# Patient Record
Sex: Female | Born: 1975 | Race: White | Hispanic: No | Marital: Married | Smoking: Never smoker
Health system: Southern US, Community
[De-identification: ages and names within clinical notes are randomized; demographics above are authoritative.]

## PROBLEM LIST (undated history)

## (undated) DIAGNOSIS — G4730 Sleep apnea, unspecified: Secondary | ICD-10-CM

## (undated) DIAGNOSIS — M069 Rheumatoid arthritis, unspecified: Secondary | ICD-10-CM

## (undated) DIAGNOSIS — A6920 Lyme disease, unspecified: Secondary | ICD-10-CM

## (undated) DIAGNOSIS — E079 Disorder of thyroid, unspecified: Secondary | ICD-10-CM

## (undated) DIAGNOSIS — L409 Psoriasis, unspecified: Secondary | ICD-10-CM

## (undated) HISTORY — DX: Disorder of thyroid, unspecified: E07.9

## (undated) HISTORY — PX: DILATION AND CURETTAGE OF UTERUS: SHX78

## (undated) HISTORY — DX: Lyme disease, unspecified: A69.20

## (undated) HISTORY — DX: Rheumatoid arthritis, unspecified: M06.9

## (undated) HISTORY — DX: Sleep apnea, unspecified: G47.30

## (undated) HISTORY — PX: TONSILLECTOMY AND ADENOIDECTOMY: SHX28

## (undated) HISTORY — DX: Psoriasis, unspecified: L40.9

---

## 2012-02-03 DIAGNOSIS — E039 Hypothyroidism, unspecified: Secondary | ICD-10-CM | POA: Insufficient documentation

## 2012-02-03 DIAGNOSIS — L639 Alopecia areata, unspecified: Secondary | ICD-10-CM | POA: Insufficient documentation

## 2015-12-09 DIAGNOSIS — J301 Allergic rhinitis due to pollen: Secondary | ICD-10-CM | POA: Insufficient documentation

## 2016-10-24 DIAGNOSIS — L409 Psoriasis, unspecified: Secondary | ICD-10-CM | POA: Insufficient documentation

## 2017-08-18 NOTE — Progress Notes (Signed)
Office Visit Note  Patient: Mariah Palmer             Date of Birth: 11/06/1975           MRN: [id number]             PCP: Hermine Messick, MD Referring: Hermine Messick, MD Visit Date: 08/24/2017 Occupation: Web designer at [employer]    Subjective:  Pain in multiple joints.   History of Present Illness: Mariah Palmer is a 42 y.o. female seen in consultation per request of her PCP.  According to patient she was diagnosed with psoriasis while she was in middle school.  She states most the lesions were on her scalp, eyelids, over her years, bilateral elbows and bilateral knees.  She states she had seen a dermatologist over the years and was given topical agents.  Currently she has been using just moisturizers.  She has used clobetasol in the past.  She states her rash usually gets worse with the humidity.  She is also noticed some peeling in the skin of her palms.  She states in January 2018 she started having joint pain and difficulty even making the bed and pulling sheets.  She waited until April 2018 when she was seen by her PCP.  At that time she was experiencing pain in her hands and feet and stiffness in her knee joints and difficulty coming down the stairs.  She states she has stiffness and swelling in her hands.  She had to change the size of her rings from 7-9.  She started going to [facility] where she had a lot of testing and was advised to go on gluten-free, dairy free, ALT free diet.  She states she changed the diet in July and her pain level improved from 5 to 2.  In October 2018 she states her life became very stressful that she moved into a different house and also started working full-time.  She started noticing increased pain and stiffness in her hands and her pain almost reached to 5.  She went back to integrative therapies where she was advised to have Lyme test but it was too expensive she decided to take the treatment instead she recalls taking doxycycline in  February 2019 and noticed improvement in her hand symptoms.  She has been on a herbal regimen since then for the last 2 months.  She states that the stiffness is not so bad in her hands and feet now.  She notices some swelling in her hands which she describes over the MCPs and PIP joints.  She had some labs done by her PCP last year which was positive for rheumatoid factor.   Activities of Daily Living:  Patient reports morning stiffness for 2-4 hours.   Patient Denies nocturnal pain.  Difficulty dressing/grooming: Reports Difficulty climbing stairs: Reports Difficulty getting out of chair: Denies Difficulty using hands for taps, buttons, cutlery, and/or writing: Reports   Review of Systems  Constitutional: Positive for fatigue. Negative for fever, night sweats, weight gain and weight loss.  HENT: Negative for ear pain, mouth sores, trouble swallowing, trouble swallowing, mouth dryness and nose dryness.   Eyes: Negative for pain, redness, visual disturbance and dryness.  Respiratory: Negative for cough, shortness of breath and difficulty breathing.   Cardiovascular: Negative for chest pain, palpitations, hypertension, irregular heartbeat and swelling in legs/feet.  Gastrointestinal: Negative for blood in stool, constipation and diarrhea.  Endocrine: Negative for excessive thirst and increased urination.  Genitourinary: Negative for  difficulty urinating and vaginal dryness.  Musculoskeletal: Positive for arthralgias, joint pain, joint swelling, myalgias, morning stiffness and myalgias. Negative for muscle weakness and muscle tenderness.  Skin: Positive for rash. Negative for color change, hair loss, skin tightness, ulcers and sensitivity to sunlight.  Allergic/Immunologic: Negative for susceptible to infections.  Neurological: Negative for dizziness, numbness, memory loss, night sweats and weakness.  Hematological: Negative for bruising/bleeding tendency and swollen glands.    Psychiatric/Behavioral: Negative for depressed mood and sleep disturbance. The patient is not nervous/anxious.     PMFS History:  Patient Active Problem List   Diagnosis Date Noted  . Rheumatoid factor positive 08/24/2017  . Psoriasis 08/24/2017  . Acquired hypothyroidism 08/24/2017    Past Medical History:  Diagnosis Date  . Lyme disease   . Psoriasis     Family History  Problem Relation Age of Onset  . Prostate cancer Father    Past Surgical History:  Procedure Laterality Date  . DILATION AND CURETTAGE OF UTERUS    . TONSILLECTOMY AND ADENOIDECTOMY     Social History   Social History Narrative  . Not on file     Objective: Vital Signs: BP 101/66 (BP Location: Left Arm, Patient Position: Sitting, Cuff Size: Normal)   Pulse 75   Ht 5\' 6"  (1.676 m)   Wt 130 lb (59 kg)   BMI 20.98 kg/m    Physical Exam  Constitutional: She is oriented to person, place, and time. She appears well-developed and well-nourished.  HENT:  Head: Normocephalic and atraumatic.  Eyes: Conjunctivae and EOM are normal.  Neck: Normal range of motion.  Cardiovascular: Normal rate, regular rhythm, normal heart sounds and intact distal pulses.  Pulmonary/Chest: Effort normal and breath sounds normal.  Abdominal: Soft. Bowel sounds are normal.  Lymphadenopathy:    She has no cervical adenopathy.  Neurological: She is alert and oriented to person, place, and time.  Skin: Skin is warm and dry. Capillary refill takes less than 2 seconds.  Erythematous plaques over bilateral elbows and forearm assistant with psoriasis  Psychiatric: She has a normal mood and affect. Her behavior is normal.  Nursing note and vitals reviewed.    Musculoskeletal Exam: C-spine thoracic lumbar spine good range of motion.  She had no SI joint tenderness.  Shoulder joints elbow joints wrist joints are good range of motion.  She had some thickening of PIP DIP joints and some tenderness over bilateral first MCP joints.  No  synovitis was appreciated.  Hip joints knee joints ankles MTPs PIPs were in good range of motion.  She has tenderness over bilateral first MTP joint.  She has bilateral hallux valgus deformity.  CDAI Exam: No CDAI exam completed.    Investigation: No additional findings.    Imaging: Xr Foot 2 Views Left  Result Date: 08/24/2017 First MTP narrowing and subluxation was noted.  PIP and DIP narrowing was noted.  No other MTPs were involved.  No erosive changes were noted.  No intertarsal joint or subtalar joint space narrowing was noted. Impression: These findings are consistent with osteoarthritis of the foot.  Xr Foot 2 Views Right  Result Date: 08/24/2017 First MTP narrowing and subluxation was noted.  PIP and DIP narrowing was noted.  No other MTPs were involved.  No erosive changes were noted.  No intertarsal joint or subtalar joint space narrowing was noted. Impression: These findings are consistent with osteoarthritis of the foot.  Xr Hand 2 View Left  Result Date: 08/24/2017 PIP and DIP narrowing was  noted no erosive changes were noted no MCP intercarpal radiocarpal or CMC narrowing was noted. Impression: These findings are consistent with osteoarthritis of the hand.  Xr Hand 2 View Right  Result Date: 08/24/2017 PIP and DIP narrowing was noted no erosive changes were noted no MCP intercarpal radiocarpal or CMC narrowing was noted. Impression: These findings are consistent with osteoarthritis of the hand.   Speciality Comments: No specialty comments available.    Procedures:  No procedures performed Allergies: Patient has no allergy information on record.   Assessment / Plan:     Visit Diagnoses: Polyarthralgia - 07/27/16: Uric acid 3.9, ANA -, RF 58.2, Sed rate 8.  She gives history of pain in her bilateral hands bilateral knees and bilateral feet.  She also gives history of intermittent swelling.  Pain in both hands -she has DIP PIP thickening on examination today.  She  also has some tenderness over bilateral first MCP joints.  Plan: XR Hand 2 View Right, XR Hand 2 View Left.  I will schedule ultrasound of bilateral hands to evaluate this further.  Pain in both feet -she has bilateral first MTP thickening and valgus deformity.  Plan: XR Foot 2 Views Right, XR Foot 2 Views Left  Rheumatoid factor positive - RF 58.2   Psoriasis -she has long-standing psoriasis since she was in middle school.  Which is controlled with topical agents.  Acquired hypothyroidism -she is receiving treatment.   Orders: Orders Placed This Encounter  Procedures  . XR Hand 2 View Right  . XR Hand 2 View Left  . XR Foot 2 Views Right  . XR Foot 2 Views Left  . 14-3-3 eta Protein  . Cyclic citrul peptide antibody, IgG  . Rheumatoid factor  . Sedimentation rate   No orders of the defined types were placed in this encounter.   Face-to-face time spent with patient was 50 minutes.>50% of time was spent in counseling and coordination of care.  Follow-Up Instructions: Return for Polyarthralgia, psoriasis.   Bo Merino, MD  Note - This record has been created using Editor, commissioning.  Chart creation errors have been sought, but may not always  have been located. Such creation errors do not reflect on  the standard of medical care.

## 2017-08-24 ENCOUNTER — Ambulatory Visit (INDEPENDENT_AMBULATORY_CARE_PROVIDER_SITE_OTHER): Payer: Self-pay

## 2017-08-24 ENCOUNTER — Ambulatory Visit (INDEPENDENT_AMBULATORY_CARE_PROVIDER_SITE_OTHER): Payer: BLUE CROSS/BLUE SHIELD | Admitting: Rheumatology

## 2017-08-24 ENCOUNTER — Encounter: Payer: Self-pay | Admitting: Rheumatology

## 2017-08-24 VITALS — BP 101/66 | HR 75 | Ht 66.0 in | Wt 130.0 lb

## 2017-08-24 DIAGNOSIS — M79671 Pain in right foot: Secondary | ICD-10-CM

## 2017-08-24 DIAGNOSIS — E039 Hypothyroidism, unspecified: Secondary | ICD-10-CM | POA: Insufficient documentation

## 2017-08-24 DIAGNOSIS — R768 Other specified abnormal immunological findings in serum: Secondary | ICD-10-CM

## 2017-08-24 DIAGNOSIS — M2550 Pain in unspecified joint: Secondary | ICD-10-CM | POA: Diagnosis not present

## 2017-08-24 DIAGNOSIS — L409 Psoriasis, unspecified: Secondary | ICD-10-CM | POA: Diagnosis not present

## 2017-08-24 DIAGNOSIS — M79641 Pain in right hand: Secondary | ICD-10-CM

## 2017-08-24 DIAGNOSIS — M79642 Pain in left hand: Secondary | ICD-10-CM | POA: Diagnosis not present

## 2017-08-24 DIAGNOSIS — M79672 Pain in left foot: Secondary | ICD-10-CM | POA: Diagnosis not present

## 2017-08-24 NOTE — Patient Instructions (Signed)
Natural anti-inflammatories  You can purchase these at [employer], [employer] or online.  . Turmeric (capsules)  . Ginger (ginger root or capsules)  . Omega 3 (Fish, flax seeds, chia seeds, walnuts, almonds)  . Tart cherry (dried or extract)   Patient should be under the care of a physician while taking these supplements. This may not be reproduced without the permission of Dr. Ziyon Soltau.  

## 2017-08-25 NOTE — Progress Notes (Signed)
Labs are consistent with rheumatoid arthritis.  Please add G6PD.  We may consider adding Plaquenil at follow-up visit.

## 2017-08-28 LAB — 14-3-3 ETA PROTEIN: 14-3-3 eta Protein: <0.2 ng/mL (ref <0.2)

## 2017-08-28 LAB — GLUCOSE 6 PHOSPHATE DEHYDROGENASE: G-6PDH: 16.1 U/g Hgb (ref 7.0–20.5)

## 2017-08-28 LAB — CYCLIC CITRUL PEPTIDE ANTIBODY, IGG

## 2017-08-28 LAB — RHEUMATOID FACTOR: Rhuematoid fact SerPl-aCnc: 42 IU/mL — ABNORMAL HIGH (ref <14)

## 2017-08-28 LAB — TEST AUTHORIZATION

## 2017-08-28 LAB — SEDIMENTATION RATE: Sed Rate: 9 mm/h (ref 0–20)

## 2017-09-01 NOTE — Progress Notes (Deleted)
Visit Diagnoses: Polyarthralgia - 07/27/16: Uric acid 3.9, ANA -, RF 58.2, Sed rate 8.  She gives history of pain in her bilateral hands bilateral knees and bilateral feet.  She also gives history of intermittent swelling.  Pain in both hands -she has DIP PIP thickening on examination today.  She also has some tenderness over bilateral first MCP joints.  Plan: XR Hand 2 View Right, XR Hand 2 View Left.  I will schedule ultrasound of bilateral hands to evaluate this further.  Pain in both feet -she has bilateral first MTP thickening and valgus deformity.  Plan: XR Foot 2 Views Right, XR Foot 2 Views Left  Rheumatoid factor positive - RF 58.2   Psoriasis -she has long-standing psoriasis since she was in middle school.  Which is controlled with topical agents.  Acquired hypothyroidism -she is receiving treatment.  RF 42, anti-CCP > 250, 14 3 3  at a negative

## 2017-09-08 ENCOUNTER — Ambulatory Visit (INDEPENDENT_AMBULATORY_CARE_PROVIDER_SITE_OTHER): Payer: Self-pay

## 2017-09-08 ENCOUNTER — Ambulatory Visit (INDEPENDENT_AMBULATORY_CARE_PROVIDER_SITE_OTHER): Payer: BLUE CROSS/BLUE SHIELD | Admitting: Rheumatology

## 2017-09-08 DIAGNOSIS — M79641 Pain in right hand: Secondary | ICD-10-CM | POA: Diagnosis not present

## 2017-09-08 DIAGNOSIS — M79642 Pain in left hand: Secondary | ICD-10-CM

## 2017-09-13 DIAGNOSIS — M19041 Primary osteoarthritis, right hand: Secondary | ICD-10-CM | POA: Insufficient documentation

## 2017-09-13 DIAGNOSIS — M19072 Primary osteoarthritis, left ankle and foot: Secondary | ICD-10-CM

## 2017-09-13 DIAGNOSIS — M19071 Primary osteoarthritis, right ankle and foot: Secondary | ICD-10-CM | POA: Insufficient documentation

## 2017-09-13 DIAGNOSIS — M0579 Rheumatoid arthritis with rheumatoid factor of multiple sites without organ or systems involvement: Secondary | ICD-10-CM | POA: Insufficient documentation

## 2017-09-13 DIAGNOSIS — M19042 Primary osteoarthritis, left hand: Secondary | ICD-10-CM

## 2017-09-13 DIAGNOSIS — Z79899 Other long term (current) drug therapy: Secondary | ICD-10-CM | POA: Insufficient documentation

## 2017-09-13 NOTE — Progress Notes (Deleted)
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   History of Present Illness: Mariah Palmer is a 42 y.o. female ***   Activities of Daily Living:  Patient reports morning stiffness for *** {minute/hour:19697}.   Patient {ACTIONS;DENIES/REPORTS:21021675::"Denies"} nocturnal pain.  Difficulty dressing/grooming: {ACTIONS;DENIES/REPORTS:21021675::"Denies"} Difficulty climbing stairs: {ACTIONS;DENIES/REPORTS:21021675::"Denies"} Difficulty getting out of chair: {ACTIONS;DENIES/REPORTS:21021675::"Denies"} Difficulty using hands for taps, buttons, cutlery, and/or writing: {ACTIONS;DENIES/REPORTS:21021675::"Denies"}   No Rheumatology ROS completed.   PMFS History:  Patient Active Problem List   Diagnosis Date Noted  . Rheumatoid arthritis involving multiple sites with positive rheumatoid factor ([place]) 09/13/2017  . High risk medication use 09/13/2017  . Primary osteoarthritis of both hands 09/13/2017  . Primary osteoarthritis of both feet 09/13/2017  . Rheumatoid factor positive 08/24/2017  . Psoriasis 08/24/2017  . Acquired hypothyroidism 08/24/2017    Past Medical History:  Diagnosis Date  . Lyme disease   . Psoriasis     Family History  Problem Relation Age of Onset  . Prostate cancer Father    Past Surgical History:  Procedure Laterality Date  . DILATION AND CURETTAGE OF UTERUS    . TONSILLECTOMY AND ADENOIDECTOMY     Social History   Social History Narrative  . Not on file     Objective: Vital Signs: There were no vitals taken for this visit.   Physical Exam   Musculoskeletal Exam: ***  CDAI Exam: No CDAI exam completed.    Investigation: No additional findings. Aug 16, 2017 '14 3 3 ' at a negative, anti-CCP greater than 250, RF 42, ESR 9,  G6PD normal  Imaging: [place] Extrem Up Bilat Comp  Result Date: 09/08/2017 Ultrasound examination of bilateral hands was performed per EULAR recommendations. Using 12 MHz transducer, grayscale and power Doppler bilateral second, third, and fifth MCP joints and bilateral wrist joints both dorsal and volar aspects were evaluated to look for synovitis or tenosynovitis. The findings were there was synovitis in bilateral second third and fifth MCPs and bilateral wrist joints on ultrasound examination. Right median nerve was 0.08 cm squares which was normal limits and left median nerve was 0.09 cm squares which was the normal limits. Impression: Ultrasound examination consistent with synovitis in bilateral MCPs and wrist joints.  Bilateral median nerves are within normal limits.  Xr Foot 2 Views Left  Result Date: 08/24/2017 First MTP narrowing and subluxation was noted.  PIP and DIP narrowing was noted.  No other MTPs were involved.  No erosive changes were noted.  No intertarsal joint or subtalar joint space narrowing was noted. Impression: These findings are consistent with osteoarthritis of the foot.  Xr Foot 2 Views Right  Result Date: 08/24/2017 First MTP narrowing and subluxation was noted.  PIP and DIP narrowing was noted.  No other MTPs were involved.  No erosive changes were noted.  No intertarsal joint or subtalar joint space narrowing was noted. Impression: These findings are consistent with osteoarthritis of the foot.  Xr Hand 2 View Left  Result Date: 08/24/2017 PIP and DIP narrowing was noted no erosive changes were noted no MCP intercarpal radiocarpal or CMC narrowing was noted. Impression: These findings are consistent  with osteoarthritis of the hand.  Xr Hand 2 View Right  Result Date: 08/24/2017 PIP and DIP narrowing was noted no erosive changes were noted no MCP intercarpal radiocarpal or CMC narrowing was noted. Impression: These findings are consistent with osteoarthritis of the  hand.   Speciality Comments: No specialty comments available.    Procedures:  No procedures performed Allergies: Patient has no allergy information on record.   Assessment / Plan:     Visit Diagnoses: Rheumatoid arthritis involving multiple sites with positive rheumatoid factor (HCC) - Positive RF, positive anti-CCP, positive synovitis on ultrasound.  High risk medication use  Primary osteoarthritis of both hands  Primary osteoarthritis of both feet  Psoriasis  Acquired hypothyroidism    Orders: No orders of the defined types were placed in this encounter.  No orders of the defined types were placed in this encounter.   Face-to-face time spent with patient was *** minutes. 50% of time was spent in counseling and coordination of care.  Follow-Up Instructions: No follow-ups on file.   Bo Merino, MD  Note - This record has been created using Editor, commissioning.  Chart creation errors have been sought, but may not always  have been located. Such creation errors do not reflect on  the standard of medical care.

## 2017-09-15 ENCOUNTER — Telehealth: Payer: Self-pay | Admitting: Rheumatology

## 2017-09-15 NOTE — Telephone Encounter (Signed)
Mariah Palmer from [facility] called requesting the office visit notes from 08/24/17 to be sent to Dr. Louanna Raw.  Fax #  [phone]

## 2017-09-15 NOTE — Telephone Encounter (Signed)
Office noted faxed.

## 2017-09-15 NOTE — Telephone Encounter (Signed)
FYI: Patient canceled npt fu for now. Patient feels like you discussed with her the plan of action at the Ultrasound, and she is not ready to start medication. Plus patient got a large bill from her visits here, and has to work out payment before she returns.

## 2017-09-23 ENCOUNTER — Ambulatory Visit: Payer: Self-pay | Admitting: Rheumatology

## 2019-03-16 ENCOUNTER — Ambulatory Visit: Payer: BC Managed Care – PPO | Attending: Internal Medicine

## 2019-03-16 DIAGNOSIS — Z20822 Contact with and (suspected) exposure to covid-19: Secondary | ICD-10-CM

## 2019-03-18 LAB — NOVEL CORONAVIRUS, NAA: SARS-CoV-2, NAA: NOT DETECTED

## 2019-08-02 NOTE — Progress Notes (Signed)
Office Visit Note  Patient: Mariah Palmer             Date of Birth: [birth year]           MRN: [id number]             PCP: Hermine Messick, MD Referring: Hermine Messick, MD Visit Date: 08/10/2019 Occupation: @GUAROCC @  Subjective:  Pain in multiple joints   History of Present Illness: Mariah Palmer is a 44 y.o. female with history of seropositive rheumatoid arthritis and osteoarthritis.  She had her initial office visit on 08/24/2017 and had an ultrasound of both hands performed on 09/08/2017 which revealed active inflammation.  The patient did not follow-up after the ultrasound to discuss treatment options for rheumatoid arthritis.  She presents today with pain in multiple joints including both shoulders, both wrists, both hands, both ankles, and both feet. She currently has swelling in the right wrist joint and both hands.  She states she was sewing for about 30 minutes which exacerbated her right wrist pain and swelling.  She has been wearing an ace wrap but has not noticed much improvement.  She experiences nocturnal pain in both shoulder joints, and she takes tylenol or ibuprofen as needed for pain relief. She currently rates her pain a 3/10.   She denies any new medical problems since her initial office visit.     Activities of Daily Living:  Patient reports morning stiffness for 6 hours.   Patient Reports nocturnal pain.  Difficulty dressing/grooming: Reports Difficulty climbing stairs: Denies Difficulty getting out of chair: Denies Difficulty using hands for taps, buttons, cutlery, and/or writing: Reports  Review of Systems  Constitutional: Positive for fatigue.  HENT: Negative for mouth sores, mouth dryness and nose dryness.   Eyes: Negative for pain, visual disturbance and dryness.  Respiratory: Negative for cough, hemoptysis, shortness of breath and difficulty breathing.   Cardiovascular: Negative for chest pain, palpitations, hypertension and swelling in legs/feet.    Gastrointestinal: Negative for blood in stool, constipation and diarrhea.  Endocrine: Negative for excessive thirst and increased urination.  Genitourinary: Negative for difficulty urinating and painful urination.  Musculoskeletal: Positive for arthralgias, joint pain, joint swelling and morning stiffness. Negative for myalgias, muscle weakness, muscle tenderness and myalgias.  Skin: Negative for color change, pallor, rash, hair loss, nodules/bumps, skin tightness, ulcers and sensitivity to sunlight.  Allergic/Immunologic: Negative for susceptible to infections.  Neurological: Negative for dizziness, numbness, headaches and weakness.  Hematological: Negative for bruising/bleeding tendency and swollen glands.  Psychiatric/Behavioral: Negative for depressed mood and sleep disturbance. The patient is not nervous/anxious.     PMFS History:  Patient Active Problem List   Diagnosis Date Noted  . Rheumatoid arthritis involving multiple sites with positive rheumatoid factor ([place]) 09/13/2017  . High risk medication use 09/13/2017  . Primary osteoarthritis of both hands 09/13/2017  . Primary osteoarthritis of both feet 09/13/2017  . Rheumatoid factor positive 08/24/2017  . Psoriasis 08/24/2017  . Acquired hypothyroidism 08/24/2017    Past Medical History:  Diagnosis Date  . Psoriasis     Family History  Problem Relation Age of Onset  . Prostate cancer Father   . Heart disease Father    Past Surgical History:  Procedure Laterality Date  . DILATION AND CURETTAGE OF UTERUS    . TONSILLECTOMY AND ADENOIDECTOMY     Social History   Social History Narrative  . Not on file    There is no immunization history on file for this patient.  Objective: Vital Signs: BP 102/61 (BP Location: Left Arm, Patient Position: Sitting, Cuff Size: Normal)   Pulse 83   Resp 14   Ht 5\' 5"  (1.651 m)   Wt 143 lb 9.6 oz (65.1 kg)   BMI 23.90 kg/m    Physical Exam Vitals and nursing note reviewed.   Constitutional:      Appearance: She is well-developed.  HENT:     Head: Normocephalic and atraumatic.  Eyes:     Conjunctiva/sclera: Conjunctivae normal.  Pulmonary:     Effort: Pulmonary effort is normal.  Abdominal:     General: Bowel sounds are normal.     Palpations: Abdomen is soft.  Musculoskeletal:     Cervical back: Normal range of motion.  Lymphadenopathy:     Cervical: No cervical adenopathy.  Skin:    General: Skin is warm and dry.     Capillary Refill: Capillary refill takes less than 2 seconds.  Neurological:     Mental Status: She is alert and oriented to person, place, and time.  Psychiatric:        Behavior: Behavior normal.      Musculoskeletal Exam: C-spine, thoracic spine, and lumbar spine good ROM.  Painful ROM of both shoulder joints.  Tenderness of both shoulder joints noted on exam.  Elbow joints have good range of motion with no tenderness or inflammation.  She has tenderness and synovitis of the right wrist.  She has tenderness and synovitis of several MCP joints and PIP joints as described below.  She has PIP and DIP thickening consistent with osteoarthritis of both hands.  Hip joints have good range of motion with no discomfort.  Knee joints have good range of motion no warmth or effusion.  Ankle joints have good range of motion with tenderness bilaterally.  She has tenderness and synovitis of bilateral first MTP joints.  CDAI Exam: CDAI Score: -- Patient Global: --; Provider Global: -- Swollen: 10 ; Tender: 15  Joint Exam 08/10/2019      Right  Left  Glenohumeral   Tender   Tender  Wrist  Swollen Tender   Tender  MCP 1  Swollen Tender  Swollen Tender  MCP 2  Swollen Tender  Swollen Tender  MCP 3  Swollen Tender  Swollen Tender  PIP 5     Swollen Tender  Ankle   Tender   Tender  MTP 1  Swollen Tender  Swollen Tender     Investigation: No additional findings.  Imaging: XR Foot 2 Views Left  Result Date: 08/10/2019 First MTP narrowing  valgus deformity and subluxation was noted.  PIP and DIP narrowing was noted.  Possible erosion was noted over first PIP and fifth MTP.  No intertarsal tibiotalar joint space narrowing was noted. Impression: These findings are consistent with rheumatoid arthritis.  XR Foot 2 Views Right  Result Date: 08/10/2019 First MTP narrowing and valgus deformity was noted.  PIP and DIP narrowing was noted.  Juxta-articular osteopenia was noted.  No intertarsal or tibiotalar joint space narrowing was noted.  Possible cystic change versus erosion was noted in the right fifth MTP. Impression: These findings are consistent with rheumatoid arthritis.  XR Hand 2 View Left  Result Date: 08/10/2019 Juxta-articular osteopenia was noted.  Erosion was noted over the left first MCP.  No intercarpal radiocarpal joint space narrowing was noted.  PIP narrowing was noted. Impression: These findings are consistent with erosive rheumatoid arthritis.  XR Hand 2 View Right  Result Date: 08/10/2019 Juxta-articular osteopenia  was noted.  Right second MCP erosion was noted.  No intercarpal radiocarpal joint space narrowing was noted.  Mild PIP narrowing was noted. Impression: These findings are consistent with erosive rheumatoid arthritis.  XR Shoulder Left  Result Date: 08/10/2019 No glenohumeral or subacromial joint space narrowing was noted.  No chondrocalcinosis was noted. Impression: Unremarkable x-ray of the shoulder.  XR Shoulder Right  Result Date: 08/10/2019 No glenohumeral or acromioclavicular joint space narrowing was noted.  No chondrocalcinosis was noted. Impression: Unremarkable x-ray of the shoulder joint.   Recent Labs: No results found for: WBC, HGB, PLT, NA, K, CL, CO2, GLUCOSE, BUN, CREATININE, BILITOT, ALKPHOS, AST, ALT, PROT, ALBUMIN, CALCIUM, GFRAA, QFTBGOLD, QFTBGOLDPLUS  Speciality Comments: No specialty comments available.  Procedures:  No procedures performed Allergies: Patient has no known  allergies.   Assessment / Plan:     Visit Diagnoses: Rheumatoid arthritis with rheumatoid factor of multiple sites without organ or systems involvement ([place]) - RF+, Anti-CCP+, U/s +synovitis 2019: She has tenderness and synovitis of multiple joints as described above.  She has been experiencing increased pain, stiffness, and intermittent joint inflammation in multiple joints including both shoulders, both wrists, both hands, both ankles, and both feet.  She has been experiencing increased nocturnal pain in both shoulder joints.  She has painful range of motion of both shoulders on exam.  She has tenderness, synovitis, and limited range of motion of the right wrist joint on exam which was exacerbated by sewing for about 30 minutes.  She has tenderness of several MCP and PIP joints as described above.  She was initially evaluated on 08/24/2017 and was found to have osteoarthritic changes in both hands and both feet on x-ray.  Her rheumatoid factor was 42 and anti-CCP was greater than 250 on 08/24/2017.  She was scheduled for an ultrasound of both hands on 09/08/2017 which revealed synovitis in bilateral MCPs and wrist joints.  Patient lost follow-up and was not interested in taking any immunosuppressive agents at that time.  She has been taking Tylenol and ibuprofen very sparingly for pain relief.  She is having increased frequency and flares this spring and is concerned about progression of her rheumatoid arthritis.  X-rays of both hands, both feet, and both shoulders were obtained today.  She has erosive changes on evident, so she will require more aggressive immunosuppression. Different treatment options were discussed. Different treatment options were discussed today.  She is not on strict contraception at this time and is unsure if she will become pregnant in the future.  She also has a needle phobia, so she is concerned about performing self injections at home.  Indications, contraindications and potential side  effects of cimzia were discussed.  All questions were addressed and consent was obtained. We will apply for in-office cimzia sq injections.  She will follow up in 6 weeks.   Counseled patient that Cimzia is a TNF blocking agent.  Counseled patient on purpose, proper use, and adverse effects of Cimzia.  Reviewed the most common adverse effects including infections, headache, and injection site reactions. Discussed that there is the possibility of an increased risk of malignancy but it is not well understood if this increased risk is due to the medication or the disease state.  Advised patient to get yearly dermatology exams due to risk of skin cancer.  Counseled patient that Cimzia should be held prior to scheduled surgery.  Counseled patient to avoid live vaccines while on Cimzia.  Recommend annual influenza, Pneumovax 23, Prevnar 13,  and Shingrix as indicated.   Reviewed the importance of regular labs while on Cimzia therapy. Standing orders placed. Provided patient with medication education material and answered all questions.  Patient voiced understanding.  Patient consented to Cimzia.  Will upload consent into the media tab.  Reviewed storage instructions for Cimzia.  Will apply for Cimzia through patient's insurance.  Advised initial injection must be administered in office.    Dose will be Cimzia 400 mg at weeks 0,2,4 then Cimzia 200 mg every 14 days or Cimzia 400 mg every 28 days  Prescription pending labs and insurance approval.   High risk medication use: Applying for in-office Cimzia sq injections.  Baseline immunosuppressive lab work was obtained today.   Pain in both hands -she has been experiencing increased pain and inflammation in both hands and both wrist joints.  She has tenderness and synovitis of the right wrist and several MCP and PIP joints as described above.  X-rays of both hands were obtained on 08/24/2017 which were consistent with osteoarthritis.  X-rays of both hands were  updated today to assess for interval change.  Plan: XR Hand 2 View Left, XR Hand 2 View Right.  X-rays revealed erosive rheumatoid arthritis.  Primary osteoarthritis of both hands: She has PIP and DIP thickening consistent with osteoarthritis of both hands.  She has been having increased pain and inflammation in both hands due to underlying rheumatoid arthritis.  X-rays of both hands were updated today.  Primary osteoarthritis of both feet - She has bilateral first MTP thickening and valgus deformity.  Tenderness and synovitis of bilateral first MTP joints noted.  She has been experiencing increased stiffness in both ankle joints and both feet.  She has tenderness of both ankle joints on exam today.  Pain in both feet -She has been experiencing increased pain and stiffness in both ankle joints and both feet.  She has tenderness and synovitis of both 1st MTP joints.  X-rays of both feet were updated today. Plan: XR Foot 2 Views Left, XR Foot 2 Views Right.  X-rays were consistent with rheumatoid arthritis.  Chronic pain of both shoulders -She has been experiencing increased pain in both shoulder joints.  She experiences nocturnal pain intermittently.  She has painful range of motion of both shoulders and tenderness on exam today.  X-rays of both shoulders were obtained.  Plan: XR Shoulder Right, XR Shoulder Left.  X-ray of bilateral shoulder joints were unremarkable.  Psoriasis - Controlled with topical agents.   Acquired hypothyroidism  Orders: Orders Placed This Encounter  Procedures  . XR Hand 2 View Left  . XR Hand 2 View Right  . XR Foot 2 Views Left  . XR Foot 2 Views Right  . XR Shoulder Right  . XR Shoulder Left  . DG Chest 2 View  . CBC with Differential/Platelet  . COMPLETE METABOLIC PANEL WITH GFR  . Sedimentation rate  . Cyclic citrul peptide antibody, IgG  . 14-3-3 eta Protein  . ANA  . Hepatitis B core antibody, IgM  . Hepatitis B surface antigen  . Hepatitis C antibody   . HIV Antibody (routine testing w rflx)  . QuantiFERON-TB Gold Plus  . Serum protein electrophoresis with reflex  . IgG, IgA, IgM   No orders of the defined types were placed in this encounter.   Face-to-face time spent with patient was 30 minutes. Greater than 50% of time was spent in counseling and coordination of care.  Follow-Up Instructions: Return in 6 weeks (  on 09/21/2019) for Rheumatoid arthritis, Osteoarthritis.   Hazel Sams, PA-C  I examined and evaluated the patient with Hazel Sams PA.  Patient has severe erosive inflammatory arthritis.  We had discussion regarding different treatment options.  She is not using strict contraception and plans to get pregnant in the future.  She also has needle phobia.  After discussing different treatment options and their side effects we decided to proceed with an office Cimzia injections.  We will apply for it.  The plan of care was discussed as noted above.  Bo Merino, MD  Note - This record has been created using Editor, commissioning.  Chart creation errors have been sought, but may not always  have been located. Such creation errors do not reflect on  the standard of medical care.

## 2019-08-10 ENCOUNTER — Ambulatory Visit (INDEPENDENT_AMBULATORY_CARE_PROVIDER_SITE_OTHER): Payer: BC Managed Care – PPO | Admitting: Rheumatology

## 2019-08-10 ENCOUNTER — Telehealth: Payer: Self-pay | Admitting: Pharmacist

## 2019-08-10 ENCOUNTER — Other Ambulatory Visit: Payer: Self-pay

## 2019-08-10 ENCOUNTER — Ambulatory Visit: Payer: Self-pay

## 2019-08-10 ENCOUNTER — Encounter: Payer: Self-pay | Admitting: Rheumatology

## 2019-08-10 VITALS — BP 102/61 | HR 83 | Resp 14 | Ht 65.0 in | Wt 143.6 lb

## 2019-08-10 DIAGNOSIS — M0579 Rheumatoid arthritis with rheumatoid factor of multiple sites without organ or systems involvement: Secondary | ICD-10-CM | POA: Diagnosis not present

## 2019-08-10 DIAGNOSIS — M25511 Pain in right shoulder: Secondary | ICD-10-CM | POA: Diagnosis not present

## 2019-08-10 DIAGNOSIS — M79672 Pain in left foot: Secondary | ICD-10-CM

## 2019-08-10 DIAGNOSIS — M19072 Primary osteoarthritis, left ankle and foot: Secondary | ICD-10-CM

## 2019-08-10 DIAGNOSIS — E039 Hypothyroidism, unspecified: Secondary | ICD-10-CM

## 2019-08-10 DIAGNOSIS — M25512 Pain in left shoulder: Secondary | ICD-10-CM

## 2019-08-10 DIAGNOSIS — M79642 Pain in left hand: Secondary | ICD-10-CM | POA: Diagnosis not present

## 2019-08-10 DIAGNOSIS — R768 Other specified abnormal immunological findings in serum: Secondary | ICD-10-CM

## 2019-08-10 DIAGNOSIS — G8929 Other chronic pain: Secondary | ICD-10-CM | POA: Diagnosis not present

## 2019-08-10 DIAGNOSIS — M19071 Primary osteoarthritis, right ankle and foot: Secondary | ICD-10-CM

## 2019-08-10 DIAGNOSIS — M19041 Primary osteoarthritis, right hand: Secondary | ICD-10-CM

## 2019-08-10 DIAGNOSIS — Z79899 Other long term (current) drug therapy: Secondary | ICD-10-CM | POA: Diagnosis not present

## 2019-08-10 DIAGNOSIS — L409 Psoriasis, unspecified: Secondary | ICD-10-CM

## 2019-08-10 DIAGNOSIS — M79671 Pain in right foot: Secondary | ICD-10-CM | POA: Diagnosis not present

## 2019-08-10 DIAGNOSIS — M79641 Pain in right hand: Secondary | ICD-10-CM

## 2019-08-10 DIAGNOSIS — M19042 Primary osteoarthritis, left hand: Secondary | ICD-10-CM

## 2019-08-10 DIAGNOSIS — M2550 Pain in unspecified joint: Secondary | ICD-10-CM

## 2019-08-10 NOTE — Telephone Encounter (Signed)
Please start benefits investigation for in office Cimzia.  Patient is naive to therapy.   Mariella Saa, PharmD, [place], [place] Clinical Specialty Pharmacist [phone]  08/10/2019 11:31 AM

## 2019-08-10 NOTE — Progress Notes (Signed)
Pharmacy Note  Subjective: Patient presents today to [facility] for follow up office visit.   Patient seen by the pharmacist for counseling on in office Cimzia for rheumatoid arthritis.  Patient is nave to therapy.  She is very hesitant to start any medication but especially injections.  Patient was to start Plaquenil in the past but declined.  Decision was made during office visit today to proceed with biologic medication due to severity of symptoms and joint damage shown on x-rays.  Patient denies family history of MS or history of CHF.  Cimzia was chosen due to patient declining contraception and possibility of pregnancy.  Objective:  CBC Pending 08/10/19  CMP  Pending 08/10/19  Baseline Immunosuppressant Therapy Labs TB GOLD Pending 08/10/19  Hepatitis Panel Pending 08/10/19  HIV Pending 08/10/19  Immunoglobulins Pending 08/10/19  SPEP Pending 08/10/19  G6PD Lab Results  Component Value Date   G6PDH 16.1 08/24/2017   TPMT No results found for: TPMT   Chest x-ray: pending 08/10/2019  Does patient have diagnosis of heart failure?  No  Assessment/Plan:  CCounseled patient that Cimzia is a TNF blocking agent.  Counseled patient on purpose, proper use, and adverse effects of Cimzia.  Reviewed the most common adverse effects including infections, headache, and injection site reactions. Discussed that there is the possibility of an increased risk of malignancy but it is not well understood if this increased risk is due to the medication or the disease state.  Advised patient to get yearly dermatology exams due to risk of skin cancer.  Counseled patient that Cimzia should be held prior to scheduled surgery.  Counseled patient to avoid live vaccines while on Cimzia.  Recommend annual influenza, Pneumovax 23, Prevnar 13, and Shingrix as indicated.   Reviewed the importance of regular labs while on Cimzia therapy. Standing orders placed. Provided patient with medication  education material and answered all questions.  Patient voiced understanding.  Patient consented to Cimzia.  Will upload consent into the media tab.  Reviewed storage instructions for Cimzia.  Will apply for in-office Cimzia through patient's insurance.   Dose will be Cimzia 400 mg at weeks 0,2,4 then Cimzia 400 mg every 28 days  Prescription pending labs and/or insurance approval.  Patient offered prednisone for hand swelling.  Patient declined.  All questions encouraged and answered.  Instructed patient to call with any further questions or concerns.  Mariella Saa, PharmD, [place], [place] Clinical Specialty Pharmacist [phone]  08/10/2019 11:10 AM

## 2019-08-10 NOTE — Patient Instructions (Signed)
Standing Labs We placed an order today for your standing lab work.    Please come back and get your standing labs after starting Cimzia in 1 month and then every 3 months.  We have open lab daily Monday through Thursday from 8:30-12:30 PM and 1:30-4:30 PM and Friday from 8:30-12:30 PM and 1:30-4:00 PM at the office of Dr. Bo Merino.   You may experience shorter wait times on Monday and Friday afternoons. The office is located at [address], [place], [place], [place] [address] No appointment is necessary.   Labs are drawn by [facility].  You may receive a bill from [place] for your lab work.  If you wish to have your labs drawn at another location, please call the office 24 hours in advance to send orders.  If you have any questions regarding directions or hours of operation,  please call [phone].   Just as a reminder please drink plenty of water prior to coming for your lab work. Thanks!  Recommend yearly skin exams due to increased risk of melanoma.

## 2019-08-13 ENCOUNTER — Encounter: Payer: Self-pay | Admitting: Rheumatology

## 2019-08-13 NOTE — Progress Notes (Signed)
Labs are consistent with rheumatoid arthritis.  I will discuss results at the follow-up visit.  SPEP and chest x-ray are pending.  Okay to start on an office Cimzia when results are available.Marland Kitchen

## 2019-08-14 NOTE — Telephone Encounter (Signed)
Submitted patient info to [employer]. Will update once we have a response.

## 2019-08-15 NOTE — Telephone Encounter (Signed)
Called and initiated In-office Cimzia Precertification. Turn around time is up to 5 business days.  [place] ID# [id number] Phone# [phone]

## 2019-08-17 LAB — COMPLETE METABOLIC PANEL WITH GFR
AG Ratio: 1.4 (calc) (ref 1.0–2.5)
ALT: 12 U/L (ref 6–29)
AST: 13 U/L (ref 10–30)
Albumin: 4.7 g/dL (ref 3.6–5.1)
Alkaline phosphatase (APISO): 62 U/L (ref 31–125)
BUN: 12 mg/dL (ref 7–25)
CO2: 27 mmol/L (ref 20–32)
Calcium: 9.6 mg/dL (ref 8.6–10.2)
Chloride: 102 mmol/L (ref 98–110)
Creat: 0.69 mg/dL (ref 0.50–1.10)
GFR, Est African American: 124 mL/min/{1.73_m2} (ref >=60)
GFR, Est Non African American: 107 mL/min/{1.73_m2} (ref >=60)
Globulin: 3.3 g/dL (calc) (ref 1.9–3.7)
Glucose, Bld: 81 mg/dL (ref 65–99)
Potassium: 4.5 mmol/L (ref 3.5–5.3)
Sodium: 137 mmol/L (ref 135–146)
Total Bilirubin: 0.7 mg/dL (ref 0.2–1.2)
Total Protein: 8 g/dL (ref 6.1–8.1)

## 2019-08-17 LAB — 14-3-3 ETA PROTEIN: 14-3-3 eta Protein: <0.2 ng/mL (ref <0.2)

## 2019-08-17 LAB — HEPATITIS C ANTIBODY
Hepatitis C Ab: NONREACTIVE
SIGNAL TO CUT-OFF: 0.02 (ref <1.00)

## 2019-08-17 LAB — CBC WITH DIFFERENTIAL/PLATELET
Absolute Monocytes: 347 cells/uL (ref 200–950)
Basophils Absolute: 41 cells/uL (ref 0–200)
Basophils Relative: 0.6 %
Eosinophils Absolute: 0 cells/uL — ABNORMAL LOW (ref 15–500)
Eosinophils Relative: 0 %
HCT: 41 % (ref 35.0–45.0)
Hemoglobin: 13.5 g/dL (ref 11.7–15.5)
Lymphs Abs: 1571 cells/uL (ref 850–3900)
MCH: 27 pg (ref 27.0–33.0)
MCHC: 32.9 g/dL (ref 32.0–36.0)
MCV: 82 fL (ref 80.0–100.0)
MPV: 9.2 fL (ref 7.5–12.5)
Monocytes Relative: 5.1 %
Neutro Abs: 4842 cells/uL (ref 1500–7800)
Neutrophils Relative %: 71.2 %
Platelets: 308 10*3/uL (ref 140–400)
RBC: 5 10*6/uL (ref 3.80–5.10)
RDW: 13 % (ref 11.0–15.0)
Total Lymphocyte: 23.1 %
WBC: 6.8 10*3/uL (ref 3.8–10.8)

## 2019-08-17 LAB — QUANTIFERON-TB GOLD PLUS
Mitogen-NIL: 8.93 IU/mL
NIL: 0.05 IU/mL
QuantiFERON-TB Gold Plus: NEGATIVE
TB1-NIL: 0 IU/mL
TB2-NIL: 0 IU/mL

## 2019-08-17 LAB — ANTI-NUCLEAR AB-TITER (ANA TITER)
ANA TITER: 1:40 {titer} — ABNORMAL HIGH
ANA Titer 1: 1:80 {titer} — ABNORMAL HIGH

## 2019-08-17 LAB — IGG, IGA, IGM
IgG (Immunoglobin G), Serum: 1388 mg/dL (ref 600–1640)
IgM, Serum: 896 mg/dL — ABNORMAL HIGH (ref 50–300)
Immunoglobulin A: 207 mg/dL (ref 47–310)

## 2019-08-17 LAB — PROTEIN ELECTROPHORESIS, SERUM, WITH REFLEX
Albumin ELP: 4.5 g/dL (ref 3.8–4.8)
Alpha 1: 0.3 g/dL (ref 0.2–0.3)
Alpha 2: 0.6 g/dL (ref 0.5–0.9)
Beta 2: 0.4 g/dL (ref 0.2–0.5)
Beta Globulin: 0.4 g/dL (ref 0.4–0.6)
Gamma Globulin: 1.8 g/dL — ABNORMAL HIGH (ref 0.8–1.7)
Total Protein: 8.1 g/dL (ref 6.1–8.1)

## 2019-08-17 LAB — ANA: Anti Nuclear Antibody (ANA): POSITIVE — AB

## 2019-08-17 LAB — HEPATITIS B SURFACE ANTIGEN: Hepatitis B Surface Ag: NONREACTIVE

## 2019-08-17 LAB — SEDIMENTATION RATE: Sed Rate: 29 mm/h — ABNORMAL HIGH (ref 0–20)

## 2019-08-17 LAB — HEPATITIS B CORE ANTIBODY, IGM: Hep B C IgM: NONREACTIVE

## 2019-08-17 LAB — HIV ANTIBODY (ROUTINE TESTING W REFLEX): HIV 1&2 Ab, 4th Generation: NONREACTIVE

## 2019-08-17 LAB — CYCLIC CITRUL PEPTIDE ANTIBODY, IGG: Cyclic Citrullin Peptide Ab: >250 UNITS — ABNORMAL HIGH

## 2019-08-17 NOTE — Telephone Encounter (Signed)
Received a fax regarding Prior Authorization from [employer] for [facility] in office. Authorization has been DENIED because patient has not had an adequate response, intolerance, or contraindication to a conventional dmard .   [place] ID# [id number] Phone# [phone]

## 2019-08-17 NOTE — Progress Notes (Signed)
All lab  results are back. CXR results are not available.

## 2019-08-23 ENCOUNTER — Encounter: Payer: Self-pay | Admitting: Pharmacist

## 2019-08-23 NOTE — Telephone Encounter (Signed)
Faxed Appeal to [employer]. Will update once we receive a response.  Phone# [phone] Fax# [phone]  1:20 PM Beatriz Chancellor, CPhT

## 2019-08-24 ENCOUNTER — Telehealth: Payer: Self-pay | Admitting: Rheumatology

## 2019-08-24 NOTE — Telephone Encounter (Signed)
Returned patient's call and advised that her current plan denied her new medication and we sent in an appeal. We are not able to advise her on what new plan to switch to, directed patient to reach out to the [facility] benefits department. Will update her once we receive a response from the insurance company.

## 2019-08-24 NOTE — Telephone Encounter (Signed)
Patient is changing to [employer]/ [facility] insurance in August. Patient was wondering if you knew, if it would make a difference with the new medication if she chose the Choice plan, or Savings plan? Also, do you think it would be better for her to hold off on medication until new insurance went into effect? Please call to discuss.

## 2019-08-24 NOTE — Telephone Encounter (Signed)
Received fax stating that appeal needs to be mailed to the plan's local office:  Grievances and appeal, [place] [facility] [id number]  Mailed out, will update once response has been received.

## 2019-09-18 NOTE — Progress Notes (Deleted)
Office Visit Note  Patient: Mariah Palmer             Date of Birth: 1975/08/05           MRN: [id number]             PCP: Hermine Messick, MD Referring: Hermine Messick, MD Visit Date: 09/28/2019 Occupation: @GUAROCC @  Subjective:  No chief complaint on file.   History of Present Illness: [name] is a 44 y.o. female ***   Activities of Daily Living:  Patient reports morning stiffness for *** {minute/hour:19697}.   Patient {ACTIONS;DENIES/REPORTS:21021675::"Denies"} nocturnal pain.  Difficulty dressing/grooming: {ACTIONS;DENIES/REPORTS:21021675::"Denies"} Difficulty climbing stairs: {ACTIONS;DENIES/REPORTS:21021675::"Denies"} Difficulty getting out of chair: {ACTIONS;DENIES/REPORTS:21021675::"Denies"} Difficulty using hands for taps, buttons, cutlery, and/or writing: {ACTIONS;DENIES/REPORTS:21021675::"Denies"}  No Rheumatology ROS completed.   PMFS History:  Patient Active Problem List   Diagnosis Date Noted  . Rheumatoid arthritis involving multiple sites with positive rheumatoid factor ([place]) 09/13/2017  . High risk medication use 09/13/2017  . Primary osteoarthritis of both hands 09/13/2017  . Primary osteoarthritis of both feet 09/13/2017  . Rheumatoid factor positive 08/24/2017  . Psoriasis 08/24/2017  . Acquired hypothyroidism 08/24/2017    Past Medical History:  Diagnosis Date  . Psoriasis     Family History  Problem Relation Age of Onset  . Prostate cancer Father   . Heart disease Father    Past Surgical History:  Procedure Laterality Date  . DILATION AND CURETTAGE OF UTERUS    . TONSILLECTOMY AND ADENOIDECTOMY     Social History   Social History Narrative  . Not on file    There is no immunization history on file for this patient.   Objective: Vital Signs: There were no vitals taken for this visit.   Physical Exam   Musculoskeletal Exam: ***  CDAI Exam: CDAI Score: -- Patient Global: --; Provider Global: -- Swollen: --; Tender:  -- Joint Exam 09/28/2019   No joint exam has been documented for this visit   There is currently no information documented on the homunculus. Go to the Rheumatology activity and complete the homunculus joint exam.  Investigation: No additional findings.  Imaging: No results found.  Recent Labs: Lab Results  Component Value Date   WBC 6.8 08/10/2019   HGB 13.5 08/10/2019   PLT 308 08/10/2019   NA 137 08/10/2019   K 4.5 08/10/2019   CL 102 08/10/2019   CO2 27 08/10/2019   GLUCOSE 81 08/10/2019   BUN 12 08/10/2019   CREATININE 0.69 08/10/2019   BILITOT 0.7 08/10/2019   AST 13 08/10/2019   ALT 12 08/10/2019   PROT 8.0 08/10/2019   PROT 8.1 08/10/2019   CALCIUM 9.6 08/10/2019   GFRAA 124 08/10/2019   QFTBGOLDPLUS NEGATIVE 08/10/2019    Speciality Comments: No specialty comments available.  Procedures:  No procedures performed Allergies: Patient has no known allergies.   Assessment / Plan:     Visit Diagnoses: No diagnosis found.  Orders: No orders of the defined types were placed in this encounter.  No orders of the defined types were placed in this encounter.   Face-to-face time spent with patient was *** minutes. Greater than 50% of time was spent in counseling and coordination of care.  Follow-Up Instructions: No follow-ups on file.   Earnestine Mealing, CMA  Note - This record has been created using Editor, commissioning.  Chart creation errors have been sought, but may not always  have been located. Such creation errors do not reflect on  the standard of medical care. 

## 2019-09-19 NOTE — Telephone Encounter (Signed)
Received notification from [employer] regarding appeal for [facility]. Authorization has been APPROVED from 08/15/19 to 08/13/20. Approval is only for 1 injection, then plan requires patient to continue via self injections, unless there is a medical reason injections need to be under MD supervision.  Authorization # [id number]  Patient is expecting an upcoming insurance change. Called patient to advise of appeal response, left message.

## 2019-09-20 ENCOUNTER — Encounter: Payer: Self-pay | Admitting: Rheumatology

## 2019-09-20 NOTE — Telephone Encounter (Signed)
Attempted to call patient, had to leave a message. ?

## 2019-09-20 NOTE — Telephone Encounter (Signed)
Spoke with patient about appeal response- plan will only allow 1 Cimzia in office injection, then will want patient to self-inject.  Patient states she has been reading up on injectible pen options for other medications and would like to discuss the different medications.   Patient will have [facility] insurance effective 10/29/19, but if unsure if she should wait or go ahead and start on current plan.  11:28 AM Beatriz Chancellor, CPhT

## 2019-09-20 NOTE — Telephone Encounter (Signed)
Amber, can you please discuss treatment options with the patient and let me know what will be the best plan at this time.

## 2019-09-22 ENCOUNTER — Telehealth: Payer: Self-pay | Admitting: Rheumatology

## 2019-09-22 NOTE — Telephone Encounter (Signed)
Call patient to discuss medication options.  Discussed anti-TNF class in depth in regards to delivery device, effectiveness, and coverage.  Patient is interested in trying Enbrel mini with auto touch device.  Patient will be switching insurance in August.  She currently has a high deductible plan and pays out-of-pocket for office visits.  She would like to delay starting therapy until her insurance changed in August.  Patient will reschedule July appointment to August.  Instructed patient to send new insurance information to the office once received in order to start benefits investigation.   Mariella Saa, PharmD, [place], CPP Clinical Specialty Pharmacist (Rheumatology and Pulmonology)  09/22/2019 11:58 AM

## 2019-09-22 NOTE — Telephone Encounter (Signed)
This patient has severe rheumatoid arthritis.  It will be her choice if she does not want to start the medication.  Her joints will deteriorate without treatment.

## 2019-09-22 NOTE — Telephone Encounter (Signed)
Patient called stating she would like to wait to get her chest x-ray until she is on her new [employer] insurance effective 10/29/19.  Patient is also asking about the time frame for the Cimzia medication once she is on new insurance and should she reschedule her 09/28/19 appointment.  Patient requested a return call.

## 2019-09-22 NOTE — Telephone Encounter (Signed)
Documented in anther encounter.  She will call the office to reschedule her appointment.

## 2019-09-28 ENCOUNTER — Ambulatory Visit: Payer: BC Managed Care – PPO | Admitting: Rheumatology

## 2019-10-06 ENCOUNTER — Telehealth: Payer: Self-pay | Admitting: Rheumatology

## 2019-10-06 NOTE — Telephone Encounter (Signed)
Has a determination been made for an appeal for Cimzia from May 2021? Please call to advise. # [phone] ext [phone] Sarah with [employer].

## 2019-10-09 NOTE — Telephone Encounter (Signed)
Advised that plan will only approve #1 in office injection. Closed Cimplicity BIV for patient.

## 2019-11-03 NOTE — Progress Notes (Deleted)
Office Visit Note  Patient: Mariah Palmer             Date of Birth: 04-Jul-1975           MRN: [id number]             PCP: Hermine Messick, MD Referring: Hermine Messick, MD Visit Date: 11/16/2019 Occupation: @GUAROCC @  Subjective:  No chief complaint on file.   History of Present Illness: Mariah Palmer is a 44 y.o. female ***   Activities of Daily Living:  Patient reports morning stiffness for *** {minute/hour:19697}.   Patient {ACTIONS;DENIES/REPORTS:21021675::"Denies"} nocturnal pain.  Difficulty dressing/grooming: {ACTIONS;DENIES/REPORTS:21021675::"Denies"} Difficulty climbing stairs: {ACTIONS;DENIES/REPORTS:21021675::"Denies"} Difficulty getting out of chair: {ACTIONS;DENIES/REPORTS:21021675::"Denies"} Difficulty using hands for taps, buttons, cutlery, and/or writing: {ACTIONS;DENIES/REPORTS:21021675::"Denies"}  No Rheumatology ROS completed.   PMFS History:  Patient Active Problem List   Diagnosis Date Noted  . Rheumatoid arthritis involving multiple sites with positive rheumatoid factor ([place]) 09/13/2017  . High risk medication use 09/13/2017  . Primary osteoarthritis of both hands 09/13/2017  . Primary osteoarthritis of both feet 09/13/2017  . Rheumatoid factor positive 08/24/2017  . Psoriasis 08/24/2017  . Acquired hypothyroidism 08/24/2017    Past Medical History:  Diagnosis Date  . Psoriasis     Family History  Problem Relation Age of Onset  . Prostate cancer Father   . Heart disease Father    Past Surgical History:  Procedure Laterality Date  . DILATION AND CURETTAGE OF UTERUS    . TONSILLECTOMY AND ADENOIDECTOMY     Social History   Social History Narrative  . Not on file    There is no immunization history on file for this patient.   Objective: Vital Signs: There were no vitals taken for this visit.   Physical Exam   Musculoskeletal Exam: ***  CDAI Exam: CDAI Score: -- Patient Global: --; Provider Global: -- Swollen: --; Tender:  -- Joint Exam 11/16/2019   No joint exam has been documented for this visit   There is currently no information documented on the homunculus. Go to the Rheumatology activity and complete the homunculus joint exam.  Investigation: No additional findings.  Imaging: No results found.  Recent Labs: Lab Results  Component Value Date   WBC 6.8 08/10/2019   HGB 13.5 08/10/2019   PLT 308 08/10/2019   NA 137 08/10/2019   K 4.5 08/10/2019   CL 102 08/10/2019   CO2 27 08/10/2019   GLUCOSE 81 08/10/2019   BUN 12 08/10/2019   CREATININE 0.69 08/10/2019   BILITOT 0.7 08/10/2019   AST 13 08/10/2019   ALT 12 08/10/2019   PROT 8.0 08/10/2019   PROT 8.1 08/10/2019   CALCIUM 9.6 08/10/2019   GFRAA 124 08/10/2019   QFTBGOLDPLUS NEGATIVE 08/10/2019    Speciality Comments: No specialty comments available.  Procedures:  No procedures performed Allergies: Patient has no known allergies.   Assessment / Plan:     Visit Diagnoses: Rheumatoid arthritis with rheumatoid factor of multiple sites without organ or systems involvement ([place])  High risk medication use  Primary osteoarthritis of both hands  Primary osteoarthritis of both feet  Psoriasis  Acquired hypothyroidism  Orders: No orders of the defined types were placed in this encounter.  No orders of the defined types were placed in this encounter.   Face-to-face time spent with patient was *** minutes. Greater than 50% of time was spent in counseling and coordination of care.  Follow-Up Instructions: No follow-ups on file.   Ofilia Neas, PA-C  Note - This record has been created using Dragon software.  Chart creation errors have been sought, but may not always  have been located. Such creation errors do not reflect on  the standard of medical care.  

## 2019-11-06 ENCOUNTER — Encounter: Payer: Self-pay | Admitting: Rheumatology

## 2019-11-06 NOTE — Telephone Encounter (Signed)
Submitted a Prior Authorization request to [facility] for ENBREL via Cover My Meds. Will update once we receive a response.   (Key: [id number]) - [id number]

## 2019-11-08 ENCOUNTER — Telehealth: Payer: Self-pay | Admitting: Pharmacist

## 2019-11-08 ENCOUNTER — Encounter: Payer: Self-pay | Admitting: Pharmacist

## 2019-11-08 NOTE — Telephone Encounter (Signed)
Appeal letter along with clinical supporting documents faxed to [facility].  Will update when we receive a response.  Fax: [phone]  Mariella Saa, PharmD, BCACP, CPP Clinical Specialty Pharmacist (Rheumatology and Pulmonology)  11/08/2019 10:12 AM

## 2019-11-08 NOTE — Telephone Encounter (Signed)
*  previously documented in another encounterBeatriz Chancellor, CPhT to Rx Rheum/Pulm     11/06/19 2:42 PM Submitted a Prior Authorization request to [facility] for ENBREL via Cover My Meds. Will update once we receive a response.    (Key: [id number]) - [id number]   Mariella Saa, PharmD, BCACP, CPP Clinical Specialty Pharmacist (Rheumatology and Pulmonology)  11/08/2019 9:56 AM

## 2019-11-08 NOTE — Telephone Encounter (Signed)
Received a fax regarding Prior Authorization from [facility] for ENBREL. Authorization has been DENIED because requires trial of or contraindication to (a medical reason to avoid) at least 3 months of treatment with at least ONE of the following DMARDs such as methotrexate dose greater than or equal to 20 mg per week or maximally tolerated dose, leflunomide, Plaquenil, or sulfasalazine..  Will submit an appeal.   Mariella Saa, PharmD, BCACP, CPP Clinical Specialty Pharmacist (Rheumatology and Pulmonology)  11/08/2019 9:55 AM

## 2019-11-09 NOTE — Telephone Encounter (Signed)
Received fax from [place] stating appeal was received and they will mail a determination to the office no later than 15 days from receipt.  Will update when we receive a response.  Mariella Saa, PharmD, [place], CPP Clinical Specialty Pharmacist (Rheumatology and Pulmonology)  11/09/2019 9:37 AM

## 2019-11-13 ENCOUNTER — Other Ambulatory Visit: Payer: Self-pay

## 2019-11-13 ENCOUNTER — Ambulatory Visit (INDEPENDENT_AMBULATORY_CARE_PROVIDER_SITE_OTHER): Payer: 59

## 2019-11-13 DIAGNOSIS — Z0389 Encounter for observation for other suspected diseases and conditions ruled out: Secondary | ICD-10-CM | POA: Diagnosis not present

## 2019-11-13 DIAGNOSIS — Z79899 Other long term (current) drug therapy: Secondary | ICD-10-CM | POA: Diagnosis not present

## 2019-11-16 ENCOUNTER — Ambulatory Visit: Payer: BC Managed Care – PPO | Admitting: Rheumatology

## 2019-11-23 NOTE — Telephone Encounter (Signed)
Received fax from [place] stating Appeal for Enbrel has been DENIED because because requires trial of or contraindication to (a medical reason to avoid) at least 3 months of treatment with at least ONE of the following DMARDs such as methotrexate dose greater than or equal to 20 mg per week or maximally tolerated dose, leflunomide, Plaquenil, or sulfasalazine.  Next step in next level appeal via external review, which must be submitted with in 4 months of denial. This review has a 45 day turn around time.   Phone# [phone]

## 2019-11-27 NOTE — Telephone Encounter (Signed)
Patient has the possibility of pregnancy therefore patient can not take MTX or Arava. Plaquenil will not be adequate for patient. Please appeal per Dr. Estanislado Pandy

## 2019-11-29 ENCOUNTER — Encounter: Payer: Self-pay | Admitting: Pharmacist

## 2019-11-29 NOTE — Telephone Encounter (Signed)
Patient scheduled for an appointment to discuss PLQ on 12/01/2019.

## 2019-11-29 NOTE — Telephone Encounter (Signed)
First level appeal was denied, next option is to do external review which can take up to 45 days.  Will submit for external review.

## 2019-11-29 NOTE — Telephone Encounter (Signed)
Please schedule an appointment to discuss treatment options.  First appeal for Enbrel has been denied.  Although PLQ may not be adequate at controlling her RA we suggest starting on PLQ to avoid continuing to delay therapy while the external review is being submitted, which can take up to 45 days.

## 2019-11-30 NOTE — Progress Notes (Signed)
Office Visit Note  Patient: Mariah Palmer             Date of Birth: Dec 25, 1975           MRN: [id number]             PCP: Hermine Messick, MD Referring: Hermine Messick, MD Visit Date: 12/01/2019 Occupation: @GUAROCC @  Subjective:  Discuss starting on PLQ  History of Present Illness: Mariah Palmer is a 44 y.o. female with history of seropositive rheumatoid arthritis, osteoarthritis, and psoriasis.  Patient presents today to discuss starting on Plaquenil while awaiting approval of enbrel.  She continues to have intermittent pain and inflammation in both hands and both wrist joints.  She states for the past 3 days her joint swelling has been more severe.  She experiences wrist pain especially with range of motion.  She states that she is also started to notice intermittent flares of psoriasis.  She has not been using any prescription topical agents recently.  She will occasionally use petroleum jelly for moisture.     Activities of Daily Living:  Patient reports morning stiffness for several  hours.   Patient Reports nocturnal pain.  Difficulty dressing/grooming: Denies Difficulty climbing stairs: Denies Difficulty getting out of chair: Denies Difficulty using hands for taps, buttons, cutlery, and/or writing: Reports  Review of Systems  Constitutional: Positive for fatigue.  HENT: Negative for mouth sores, mouth dryness and nose dryness.   Eyes: Negative for itching and dryness.  Respiratory: Negative for shortness of breath and difficulty breathing.   Cardiovascular: Negative for chest pain and palpitations.  Gastrointestinal: Negative for blood in stool, constipation and diarrhea.  Endocrine: Negative for increased urination.  Genitourinary: Negative for difficulty urinating.  Musculoskeletal: Positive for arthralgias, joint pain, joint swelling and morning stiffness. Negative for myalgias, muscle tenderness and myalgias.  Skin: Negative for color change, rash and redness.    Allergic/Immunologic: Negative for susceptible to infections.  Neurological: Negative for dizziness, numbness, headaches, memory loss and weakness.  Hematological: Negative for bruising/bleeding tendency.  Psychiatric/Behavioral: Negative for confusion.    PMFS History:  Patient Active Problem List   Diagnosis Date Noted  . Rheumatoid arthritis involving multiple sites with positive rheumatoid factor ([place]) 09/13/2017  . High risk medication use 09/13/2017  . Primary osteoarthritis of both hands 09/13/2017  . Primary osteoarthritis of both feet 09/13/2017  . Rheumatoid factor positive 08/24/2017  . Psoriasis 08/24/2017  . Acquired hypothyroidism 08/24/2017    Past Medical History:  Diagnosis Date  . Psoriasis     Family History  Problem Relation Age of Onset  . Prostate cancer Father   . Heart disease Father    Past Surgical History:  Procedure Laterality Date  . DILATION AND CURETTAGE OF UTERUS    . TONSILLECTOMY AND ADENOIDECTOMY     Social History   Social History Narrative  . Not on file   Immunization History  Administered Date(s) Administered  . PFIZER SARS-COV-2 Vaccination 04/24/2019, 05/16/2019     Objective: Vital Signs: BP 113/77 (BP Location: Left Arm, Patient Position: Sitting, Cuff Size: Normal)   Pulse 67   Resp 13   Ht 5\' 5"  (1.651 m)   Wt 148 lb 6.4 oz (67.3 kg)   BMI 24.70 kg/m    Physical Exam Vitals and nursing note reviewed.  Constitutional:      Appearance: She is well-developed.  HENT:     Head: Normocephalic and atraumatic.  Eyes:     Conjunctiva/sclera: Conjunctivae normal.  Pulmonary:     Effort: Pulmonary effort is normal.  Abdominal:     Palpations: Abdomen is soft.  Musculoskeletal:     Cervical back: Normal range of motion.  Skin:    General: Skin is warm and dry.     Capillary Refill: Capillary refill takes less than 2 seconds.  Neurological:     Mental Status: She is alert and oriented to person, place, and time.   Psychiatric:        Behavior: Behavior normal.      Musculoskeletal Exam: C-spine, thoracic spine, lumbar spine have good range of motion.  Shoulder joints and elbow joints have good range of motion with no discomfort.  Rheumatoid nodule palpable on the extensor surface of the right elbow.  She has tenderness and inflammation of the right wrist joint.  Painful range of motion of both wrists noted.  She has tenderness and synovitis of several MCP joints as described below.  She has subluxation of bilateral fourth DIP joints.  Knee joints have good range of motion with no warmth or effusion.  She has warmth and tenderness of the left ankle joint on exam.  CDAI Exam: CDAI Score: -- Patient Global: --; Provider Global: -- Swollen: 9 ; Tender: 9  Joint Exam 12/01/2019      Right  Left  Wrist  Swollen Tender     MCP 1  Swollen Tender  Swollen Tender  MCP 2  Swollen Tender  Swollen Tender  MCP 3  Swollen Tender     MCP 4  Swollen Tender     MCP 5  Swollen Tender     Ankle     Swollen Tender     Investigation: No additional findings.  Imaging: DG Chest 2 View  Result Date: 11/13/2019 CLINICAL DATA:  May no suppressive therapy EXAM: CHEST - 2 VIEW COMPARISON:  None. FINDINGS: The heart size and mediastinal contours are within normal limits. Both lungs are clear. The visualized skeletal structures are unremarkable. IMPRESSION: No active cardiopulmonary disease. Electronically Signed   By: Davina Poke D.O.   On: 11/13/2019 15:24    Recent Labs: Lab Results  Component Value Date   WBC 6.8 08/10/2019   HGB 13.5 08/10/2019   PLT 308 08/10/2019   NA 137 08/10/2019   K 4.5 08/10/2019   CL 102 08/10/2019   CO2 27 08/10/2019   GLUCOSE 81 08/10/2019   BUN 12 08/10/2019   CREATININE 0.69 08/10/2019   BILITOT 0.7 08/10/2019   AST 13 08/10/2019   ALT 12 08/10/2019   PROT 8.0 08/10/2019   PROT 8.1 08/10/2019   CALCIUM 9.6 08/10/2019   GFRAA 124 08/10/2019   QFTBGOLDPLUS NEGATIVE  08/10/2019    Speciality Comments: No specialty comments available.  Procedures:  No procedures performed Allergies: Patient has no known allergies.   Assessment / Plan:     Visit Diagnoses: Rheumatoid arthritis with rheumatoid factor of multiple sites without organ or systems involvement ([place]) -She has ongoing joint tenderness and synovitis as described above.  She is currently having a flare which started 3 days ago in her right wrist and both hands.  She has tenderness and warmth in the left ankle joint as well.  We are currently awaiting approval by her insurance to start her on Enbrel 50 mg subcutaneous injections once weekly.  In order to not delay treatment she will be starting on Plaquenil 200 mg 1 tablet by mouth twice daily Monday through Friday.  Indications, contraindications, potential side  effects of Plaquenil were discussed today.  All questions were addressed and consent was obtained.  She is aware that she will require updated lab work in 1 month in 3 months and every 5 months.  Standing orders for CBC and CMP will be placed today.  She was advised to notify us if she cannot tolerate taking Plaquenil.  The prescription was sent to the pharmacy today.  We will continue to try to get the approval for Enbrel since she will need more aggressive immunosuppression to control her rheumatoid arthritis.  She will follow-up in the office in 2 months to assess her response to Plaquenil.  Plan: hydroxychloroquine (PLAQUENIL) 200 MG tablet  Patient was counseled on the purpose, proper use, and adverse effects of hydroxychloroquine including nausea/diarrhea, skin rash, headaches, and sun sensitivity.  Discussed importance of annual eye exams while on hydroxychloroquine to monitor to ocular toxicity and discussed importance of frequent laboratory monitoring.  Provided patient with eye exam form for baseline ophthalmologic exam.  Provided patient with educational materials on hydroxychloroquine and  answered all questions.  Patient consented to hydroxychloroquine.  Will upload consent in the media tab.    Dose will be Plaquenil 200 mg twice daily Monday through Friday.  Prescription pending lab results.  High risk medication use - She will be starting on Plaquenil 200 mg 1 tablet by mouth twice daily Monday through Friday.  She will return for lab work in 1 month in 3 months and every 5 months to monitor for drug toxicity.  Standing orders for CBC and CMP will be placed today.  Insurance denied Enbrel-awaiting external review.  She will require more aggressive treatment once approved.  Not candidate for MTX/Arava-only using barrier method of contraception.  Primary osteoarthritis of both hands: She has mild PIP and DIP thickening consistent with osteoarthritis of both hands.  Fourth DIP subluxation noted bilaterally.  Primary osteoarthritis of both feet: She is not experiencing any discomfort in her feet at this time.  She has tenderness and warmth of the left ankle joint on exam.  Psoriasis: She has small patches of psoriasis on the extensor surface of both elbows and behind both ears.  She declined a prescription topical agent.  She was advised to notify us if her psoriasis persists or worsens.  Acquired hypothyroidism  Orders: No orders of the defined types were placed in this encounter.  Meds ordered this encounter  Medications  . hydroxychloroquine (PLAQUENIL) 200 MG tablet    Sig: Take 1 tablet 200 mg BID Monday-Friday    Dispense:  120 tablet    Refill:  0      Follow-Up Instructions: Return in about 2 months (around 01/31/2020) for Rheumatoid arthritis.   Ofilia Neas, PA-C  Note - This record has been created using Dragon software.  Chart creation errors have been sought, but may not always  have been located. Such creation errors do not reflect on  the standard of medical care.

## 2019-12-01 ENCOUNTER — Ambulatory Visit: Payer: 59 | Admitting: Physician Assistant

## 2019-12-01 ENCOUNTER — Other Ambulatory Visit: Payer: Self-pay | Admitting: Physician Assistant

## 2019-12-01 ENCOUNTER — Encounter: Payer: Self-pay | Admitting: Physician Assistant

## 2019-12-01 ENCOUNTER — Other Ambulatory Visit: Payer: Self-pay

## 2019-12-01 VITALS — BP 113/77 | HR 67 | Resp 13 | Ht 65.0 in | Wt 148.4 lb

## 2019-12-01 DIAGNOSIS — M19041 Primary osteoarthritis, right hand: Secondary | ICD-10-CM

## 2019-12-01 DIAGNOSIS — M19042 Primary osteoarthritis, left hand: Secondary | ICD-10-CM | POA: Diagnosis not present

## 2019-12-01 DIAGNOSIS — L409 Psoriasis, unspecified: Secondary | ICD-10-CM

## 2019-12-01 DIAGNOSIS — Z79899 Other long term (current) drug therapy: Secondary | ICD-10-CM | POA: Diagnosis not present

## 2019-12-01 DIAGNOSIS — M19071 Primary osteoarthritis, right ankle and foot: Secondary | ICD-10-CM | POA: Diagnosis not present

## 2019-12-01 DIAGNOSIS — M0579 Rheumatoid arthritis with rheumatoid factor of multiple sites without organ or systems involvement: Secondary | ICD-10-CM | POA: Diagnosis not present

## 2019-12-01 DIAGNOSIS — M19072 Primary osteoarthritis, left ankle and foot: Secondary | ICD-10-CM

## 2019-12-01 DIAGNOSIS — E039 Hypothyroidism, unspecified: Secondary | ICD-10-CM

## 2019-12-01 MED ORDER — HYDROXYCHLOROQUINE SULFATE 200 MG PO TABS: ORAL_TABLET | ORAL | 0 refills | Status: DC

## 2019-12-01 MED FILL — HYDROXYCHLOROQUINE 200 MG T: 200 | 56 days supply | Qty: 80 | Fill #0

## 2019-12-01 NOTE — Patient Instructions (Signed)
Standing Labs We placed an order today for your standing lab work.   Please have your standing labs drawn in 1 month, 3 months, and every 5 months   If possible, please have your labs drawn 2 weeks prior to your appointment so that the provider can discuss your results at your appointment.  We have open lab daily Monday through Thursday from 8:30-12:30 PM and 1:30-4:30 PM and Friday from 8:30-12:30 PM and 1:30-4:00 PM at the office of Dr. Bo Merino, [facility].   Please be advised, patients with office appointments requiring lab work will take precedents over walk-in lab work.  If possible, please come for your lab work on Monday and Friday afternoons, as you may experience shorter wait times. The office is located at [address] [place], [place], [place] [address] No appointment is necessary.   Labs are drawn by [facility]. Please bring your co-pay at the time of your lab draw.  You may receive a bill from [place] for your lab work.  If you wish to have your labs drawn at another location, please call the office 24 hours in advance to send orders.  If you have any questions regarding directions or hours of operation,  please call [phone].   As a reminder, please drink plenty of water prior to coming for your lab work. Thanks!    Hydroxychloroquine tablets What is this medicine? HYDROXYCHLOROQUINE (hye drox ee KLOR oh kwin) is used to treat rheumatoid arthritis and systemic lupus erythematosus. It is also used to treat malaria. This medicine may be used for other purposes; ask your health care provider or pharmacist if you have questions. COMMON BRAND NAME(S): Plaquenil, Quineprox What should I tell my health care provider before I take this medicine? They need to know if you have any of these conditions:  diabetes  eye disease, vision problems  G6PD deficiency  heart disease  history of irregular heartbeat  if you often drink alcohol  kidney  disease  liver disease  porphyria  psoriasis  an unusual or allergic reaction to chloroquine, hydroxychloroquine, other medicines, foods, dyes, or preservatives  pregnant or trying to get pregnant  breast-feeding How should I use this medicine? Take this medicine by mouth with a glass of water. Follow the directions on the prescription label. Do not cut, crush or chew this medicine. Swallow the tablets whole. Take this medicine with food. Avoid taking antacids within 4 hours of taking this medicine. It is best to separate these medicines by at least 4 hours. Take your medicine at regular intervals. Do not take it more often than directed. Take all of your medicine as directed even if you think you are better. Do not skip doses or stop your medicine early. Talk to your pediatrician regarding the use of this medicine in children. While this drug may be prescribed for selected conditions, precautions do apply. Overdosage: If you think you have taken too much of this medicine contact a poison control center or emergency room at once. NOTE: This medicine is only for you. Do not share this medicine with others. What if I miss a dose? If you miss a dose, take it as soon as you can. If it is almost time for your next dose, take only that dose. Do not take double or extra doses. What may interact with this medicine? Do not take this medicine with any of the following medications:  cisapride  dronedarone  pimozide  thioridazine This medicine may also interact with  the following medications:  ampicillin  antacids  cimetidine  cyclosporine  digoxin  kaolin  medicines for diabetes, like insulin, glipizide, glyburide  medicines for seizures like carbamazepine, phenobarbital, phenytoin  mefloquine  methotrexate  other medicines that prolong the QT interval (cause an abnormal heart rhythm)  praziquantel This list may not describe all possible interactions. Give your health care  provider a list of all the medicines, herbs, non-prescription drugs, or dietary supplements you use. Also tell them if you smoke, drink alcohol, or use illegal drugs. Some items may interact with your medicine. What should I watch for while using this medicine? Visit your health care professional for regular checks on your progress. Tell your health care professional if your symptoms do not start to get better or if they get worse. You may need blood work done while you are taking this medicine. If you take other medicines that can affect heart rhythm, you may need more testing. Talk to your health care professional if you have questions. Your vision may be tested before and during use of this medicine. Tell your health care professional right away if you have any change in your eyesight. What side effects may I notice from receiving this medicine? Side effects that you should report to your doctor or health care professional as soon as possible:  allergic reactions like skin rash, itching or hives, swelling of the face, lips, or tongue  changes in vision  decreased hearing or ringing of the ears  muscle weakness  redness, blistering, peeling or loosening of the skin, including inside the mouth  sensitivity to light  signs and symptoms of a dangerous change in heartbeat or heart rhythm like chest pain; dizziness; fast or irregular heartbeat; palpitations; feeling faint or lightheaded, falls; breathing problems  signs and symptoms of liver injury like dark yellow or brown urine; general ill feeling or flu-like symptoms; light-colored stools; loss of appetite; nausea; right upper belly pain; unusually weak or tired; yellowing of the eyes or skin  signs and symptoms of low blood sugar such as feeling anxious; confusion; dizziness; increased hunger; unusually weak or tired; sweating; shakiness; cold; irritable; headache; blurred vision; fast heartbeat; loss of consciousness  suicidal  thoughts  uncontrollable head, mouth, neck, arm, or leg movements Side effects that usually do not require medical attention (report to your doctor or health care professional if they continue or are bothersome):  diarrhea  dizziness  hair loss  headache  irritable  loss of appetite  nausea, vomiting  stomach pain This list may not describe all possible side effects. Call your doctor for medical advice about side effects. You may report side effects to FDA at 1-800-FDA-1088. Where should I keep my medicine? Keep out of the reach of children. Store at room temperature between 15 and 30 degrees C (59 and 86 degrees F). Protect from moisture and light. Throw away any unused medicine after the expiration date. NOTE: This sheet is a summary. It may not cover all possible information. If you have questions about this medicine, talk to your doctor, pharmacist, or health care provider.  2020 Elsevier/Gold Standard (2018-07-25 12:56:32)  COVID-19 vaccine recommendations:   COVID-19 vaccine is recommended for everyone (unless you are allergic to a vaccine component), even if you are on a medication that suppresses your immune system.   If you are on Methotrexate, Cellcept (mycophenolate), Rinvoq, Morrie Sheldon, and Olumiant- hold the medication for 1 week after each vaccine. Hold Methotrexate for 2 weeks after the single dose COVID-19  vaccine.   If you are on Orencia subcutaneous injection - hold medication one week prior to and one week after the first COVID-19 vaccine dose (only).   If you are on Orencia IV infusions- time vaccination administration so that the first COVID-19 vaccination will occur four weeks after the infusion and postpone the subsequent infusion by one week.   If you are on Cyclophosphamide or Rituxan infusions please contact your doctor prior to receiving the COVID-19 vaccine.   Do not take Tylenol or any anti-inflammatory medications (NSAIDs) 24 hours prior to the  COVID-19 vaccination.   There is no direct evidence about the efficacy of the COVID-19 vaccine in individuals who are on medications that suppress the immune system.   Even if you are fully vaccinated, and you are on any medications that suppress your immune system, please continue to wear a mask, maintain at least six feet social distance and practice hand hygiene.   If you develop a COVID-19 infection, please contact your PCP or our office to determine if you need antibody infusion.  The booster vaccine is now available for immunocompromised patients. It is advised that if you had Pfizer vaccine you should get [employer] booster.  If you had a Moderna vaccine then you should get a Moderna booster. Johnson and Wynetta Emery does not have a booster vaccine at this time.  Please see the following web sites for updated information.   https://[email or web address]/[email or web address]/0/Files/COVID-19-Vaccination-Patient-Resources.[email or web address]  [email or web address]://[email or web address]/About-Us/Newsroom/Press-Releases/ID/1159

## 2019-12-01 NOTE — Addendum Note (Signed)
Addended by: Earnestine Mealing on: 12/01/2019 01:53 PM   Modules accepted: Orders

## 2019-12-05 ENCOUNTER — Other Ambulatory Visit (HOSPITAL_COMMUNITY): Payer: Self-pay

## 2019-12-05 MED FILL — LEVOTHYROXINE SODIUM 25 MCG: 25 | 90 days supply | Qty: 90 | Fill #0

## 2019-12-05 MED FILL — NP THYROID 90 MG TABLET: 90 | 90 days supply | Qty: 90 | Fill #0

## 2019-12-05 MED FILL — PROGESTERONE 100 MG CAPS: 100 | 90 days supply | Qty: 90 | Fill #0

## 2019-12-12 ENCOUNTER — Telehealth: Payer: Self-pay | Admitting: Pharmacist

## 2019-12-12 NOTE — Telephone Encounter (Signed)
Please notify the patient that the external review appeal was denied for Enbrel. Please see how the patient is tolerating Plaquenil and if she has noticed any clinical improvement yet. She will be returning to the office on 02/01/2020 and we will reassess at that time. If she has an inadequate response to Plaquenil we will try a new appeal for Enbrel at that time.

## 2019-12-12 NOTE — Telephone Encounter (Signed)
Thank you for informing me.  We will further evaluate at her upcoming office visit.

## 2019-12-12 NOTE — Telephone Encounter (Signed)
Received fax from [facility] in regards to external review decision for approval for Enbrel.  External review appeal was denied. Patient must have a previous trial of or contraindication to at least 3 months of treatment with one of the following DMARDS: Plaquenil, Arava, SSZ, MTX at dose 20 mg or higher. Document sent to scan center.  Patient was recently seen in office and started on Plaquenil.  An appeal or new prior authorization can be submitted if patient has an adequate response to Plaquenil at next office visit.   Mariella Saa, PharmD, [place], CPP Clinical Specialty Pharmacist (Rheumatology and Pulmonology)  12/12/2019 8:22 AM

## 2019-12-12 NOTE — Telephone Encounter (Signed)
Patient advised the external review appeal was denied for Enbrel. Patient states she is tolerating PLQ well with no adverse reactions. Patient states she is less stiff in the morning and has not worn her compression gloves to bed since starting. Patient states she has had her normal inflammation.

## 2020-01-16 DIAGNOSIS — Z79899 Other long term (current) drug therapy: Secondary | ICD-10-CM | POA: Diagnosis not present

## 2020-01-16 DIAGNOSIS — H5203 Hypermetropia, bilateral: Secondary | ICD-10-CM | POA: Diagnosis not present

## 2020-01-18 NOTE — Progress Notes (Signed)
Office Visit Note  Patient: Mariah Palmer             Date of Birth: [birth year]           MRN: [id number]             PCP: Hermine Messick, MD Referring: Hermine Messick, MD Visit Date: 02/01/2020 Occupation: @GUAROCC @  Subjective:  Medication management  History of Present Illness: Mariah Palmer is a 44 y.o. female with history of seronegative rheumatoid arthritis and osteoarthritis.  She is taking plaquenil 200 mg 1 tablet by mouth twice daily M-F. Plaquenil was started after her last office visit on 12/01/19. She is tolerating plaquenil without any side effects.  She has noticed about a 75% improvement since starting on PLQ. She states over the past 2-3 weeks she has noticed some return of joint pain and stiffness which she attributes to cooler weather temperatures.  She continues to notice swelling in both hands and the left wrist.  She states her morning stiffness has improved and she denies any nocturnal pain.      Activities of Daily Living:  Patient reports morning stiffness for a few   minutes.   Patient Denies nocturnal pain.  Difficulty dressing/grooming: Reports Difficulty climbing stairs: Denies Difficulty getting out of chair: Denies Difficulty using hands for taps, buttons, cutlery, and/or writing: Reports  Review of Systems  Constitutional: Positive for fatigue.  HENT: Negative for mouth sores, mouth dryness and nose dryness.   Eyes: Negative for pain, visual disturbance and dryness.  Respiratory: Negative for cough, hemoptysis, shortness of breath and difficulty breathing.   Cardiovascular: Negative for chest pain, palpitations, hypertension and swelling in legs/feet.  Gastrointestinal: Negative for blood in stool, constipation and diarrhea.  Endocrine: Negative for increased urination.  Genitourinary: Negative for painful urination.  Musculoskeletal: Positive for arthralgias, joint pain, joint swelling and morning stiffness. Negative for myalgias, muscle weakness,  muscle tenderness and myalgias.  Skin: Negative for color change, pallor, rash, hair loss, nodules/bumps, skin tightness, ulcers and sensitivity to sunlight.  Allergic/Immunologic: Negative for susceptible to infections.  Neurological: Negative for dizziness, numbness, headaches and weakness.  Hematological: Negative for swollen glands.  Psychiatric/Behavioral: Negative for depressed mood and sleep disturbance. The patient is not nervous/anxious.     PMFS History:  Patient Active Problem List   Diagnosis Date Noted  . Rheumatoid arthritis involving multiple sites with positive rheumatoid factor ([place]) 09/13/2017  . High risk medication use 09/13/2017  . Primary osteoarthritis of both hands 09/13/2017  . Primary osteoarthritis of both feet 09/13/2017  . Rheumatoid factor positive 08/24/2017  . Psoriasis 08/24/2017  . Acquired hypothyroidism 08/24/2017    Past Medical History:  Diagnosis Date  . Psoriasis     Family History  Problem Relation Age of Onset  . Prostate cancer Father   . Heart disease Father    Past Surgical History:  Procedure Laterality Date  . DILATION AND CURETTAGE OF UTERUS    . TONSILLECTOMY AND ADENOIDECTOMY     Social History   Social History Narrative  . Not on file   Immunization History  Administered Date(s) Administered  . PFIZER SARS-COV-2 Vaccination 04/24/2019, 05/16/2019     Objective: Vital Signs: BP 106/73 (BP Location: Left Arm, Patient Position: Sitting, Cuff Size: Normal)   Pulse 79   Resp 14   Ht 5\' 5"  (1.651 m)   Wt 147 lb 6.4 oz (66.9 kg)   BMI 24.53 kg/m    Physical Exam Vitals and nursing note  reviewed.  Constitutional:      Appearance: She is well-developed.  HENT:     Head: Normocephalic and atraumatic.  Eyes:     Conjunctiva/sclera: Conjunctivae normal.  Pulmonary:     Effort: Pulmonary effort is normal.  Abdominal:     Palpations: Abdomen is soft.  Musculoskeletal:     Cervical back: Normal range of motion.    Skin:    General: Skin is warm and dry.     Capillary Refill: Capillary refill takes less than 2 seconds.  Neurological:     Mental Status: She is alert and oriented to person, place, and time.  Psychiatric:        Behavior: Behavior normal.      Musculoskeletal Exam:  C-spine, thoracic spine, and lumbar good ROM with no discomfort.  Shoulder joints and elbows good ROM with no discomfort.  Tenderness and synovitis of the left wrist joint.  Tenderness and synovitis of the right 1st, 2nd, and 3rd MCPs and left 1st MCP joint.  No tenderness or inflammation of PIP or DIP joints.  Hip joints and knee joints have good ROM with no discomfort.  No warmth or effusion of knee joints.  Warmth of the left ankle joint noted.  No tenderness of MTP joints.  CDAI Exam: CDAI Score: 10.8  Patient Global: 2 mm; Provider Global: 6 mm Swollen: 6 ; Tender: 6  Joint Exam 02/01/2020      Right  Left  Wrist     Swollen Tender  MCP 1  Swollen Tender  Swollen Tender  MCP 2  Swollen Tender     MCP 3  Swollen Tender     Ankle     Swollen Tender     Investigation: No additional findings.  Imaging: No results found.  Recent Labs: Lab Results  Component Value Date   WBC 6.8 08/10/2019   HGB 13.5 08/10/2019   PLT 308 08/10/2019   NA 137 08/10/2019   K 4.5 08/10/2019   CL 102 08/10/2019   CO2 27 08/10/2019   GLUCOSE 81 08/10/2019   BUN 12 08/10/2019   CREATININE 0.69 08/10/2019   BILITOT 0.7 08/10/2019   AST 13 08/10/2019   ALT 12 08/10/2019   PROT 8.0 08/10/2019   PROT 8.1 08/10/2019   CALCIUM 9.6 08/10/2019   GFRAA 124 08/10/2019   QFTBGOLDPLUS NEGATIVE 08/10/2019    Speciality Comments: Comprehensive eye exam 01/16/2020 normal - performed at [facility] [place], follow up in 6 months   Procedures:  No procedures performed Allergies: Patient has no known allergies.   Assessment / Plan:     Visit Diagnoses: Rheumatoid arthritis with rheumatoid factor of multiple sites without  organ or systems involvement [facility]): Erosive rheumatoid arthritis: She has ongoing joint tenderness and synovitis of multiple joints as described above.  She was started on plaquenil 200 mg 1 tablet by mouth twice daily M-F after her last visit on 12/01/19.  She is tolerating plaquenil without any side effects and has not missed any doses.  She has noticed a 75% in her symptoms since starting on PLQ.  Her morning stiffness has only been lasting a few minutes and she is not experiencing any nocturnal pain.  She currently rates her pain a 2/10.  With ongoing joint inflammation we discussed the concern for progression of joint damage. We discussed that she will likely require combination therapy with MTX or a biologic to control her rheumatoid arthritis but she would like to give PLQ more time.  She is apprehensive to start MTX or Arava due to possibly planning pregnancy in the future.  She does not want to make any medication changes at this time. She will continue to take PLQ as prescribed.  She does not need any refills at this time.  She was advised to notify us if her joint pain and inflammation persists or worsens.  She will follow up in 2 months to reassess her response to PLQ as monotherapy.   High risk medication use - Plaquenil 200 mg 1 tablet by mouth twice daily Monday through Friday. She does not want to make any medication changes at this time.  Due to update CBC and CMP. Standing orders for CBC and CMP are in place.  She is planning on have lab work drawn this week.  Her next lab work will be due in 3 months then every 5 months. Comprehensive eye exam 01/16/2020 normal - performed at [facility], follow up in 6 months.  She has not had any recent infections.  She has received both covid-19 vaccine doses.   Primary osteoarthritis of both hands: She has ongoing pain and inflammation in both hands due to underlying rheumatoid arthritis.  She is able to make a complete fist bilaterally.   She has no tenderness, inflammation, or thickening of PIP and DIP joints.    Primary osteoarthritis of both feet: She is not having any discomfort in her feet at this time.  Morning stiffness has improved.  She has persistent warmth of the left ankle joint on exam.   Psoriasis: She has no active psoriasis at this time.   Acquired hypothyroidism: She continues to experience fatigue on a daily basis. She will be having updated lab work with PCP soon.  Orders: No orders of the defined types were placed in this encounter.  No orders of the defined types were placed in this encounter.    Follow-Up Instructions: Return in about 2 months (around 04/02/2020) for Rheumatoid arthritis, Osteoarthritis.   Ofilia Neas, PA-C  Note - This record has been created using Dragon software.  Chart creation errors have been sought, but may not always  have been located. Such creation errors do not reflect on  the standard of medical care.

## 2020-01-26 MED FILL — HYDROXYCHLOROQUINE 200 MG T: 200 | 28 days supply | Qty: 40 | Fill #1

## 2020-02-01 ENCOUNTER — Ambulatory Visit: Payer: 59 | Admitting: Physician Assistant

## 2020-02-01 ENCOUNTER — Encounter: Payer: Self-pay | Admitting: Physician Assistant

## 2020-02-01 ENCOUNTER — Other Ambulatory Visit: Payer: Self-pay

## 2020-02-01 VITALS — BP 106/73 | HR 79 | Resp 14 | Ht 65.0 in | Wt 147.4 lb

## 2020-02-01 DIAGNOSIS — M19072 Primary osteoarthritis, left ankle and foot: Secondary | ICD-10-CM

## 2020-02-01 DIAGNOSIS — Z79899 Other long term (current) drug therapy: Secondary | ICD-10-CM

## 2020-02-01 DIAGNOSIS — M19071 Primary osteoarthritis, right ankle and foot: Secondary | ICD-10-CM

## 2020-02-01 DIAGNOSIS — L409 Psoriasis, unspecified: Secondary | ICD-10-CM | POA: Diagnosis not present

## 2020-02-01 DIAGNOSIS — M0579 Rheumatoid arthritis with rheumatoid factor of multiple sites without organ or systems involvement: Secondary | ICD-10-CM

## 2020-02-01 DIAGNOSIS — M19042 Primary osteoarthritis, left hand: Secondary | ICD-10-CM

## 2020-02-01 DIAGNOSIS — E039 Hypothyroidism, unspecified: Secondary | ICD-10-CM | POA: Diagnosis not present

## 2020-02-01 DIAGNOSIS — M19041 Primary osteoarthritis, right hand: Secondary | ICD-10-CM

## 2020-02-26 ENCOUNTER — Telehealth: Payer: Self-pay | Admitting: *Deleted

## 2020-02-26 ENCOUNTER — Other Ambulatory Visit: Payer: Self-pay | Admitting: Physician Assistant

## 2020-02-26 ENCOUNTER — Other Ambulatory Visit: Payer: Self-pay | Admitting: *Deleted

## 2020-02-26 DIAGNOSIS — Z79899 Other long term (current) drug therapy: Secondary | ICD-10-CM | POA: Diagnosis not present

## 2020-02-26 DIAGNOSIS — E039 Hypothyroidism, unspecified: Secondary | ICD-10-CM | POA: Diagnosis not present

## 2020-02-26 DIAGNOSIS — M0579 Rheumatoid arthritis with rheumatoid factor of multiple sites without organ or systems involvement: Secondary | ICD-10-CM

## 2020-02-26 NOTE — Telephone Encounter (Signed)
Patient contacted the office about her PLQ refill request being denied. Patient advised she is overdue for labs and will need to update them prior to refill. Patient states she will go this afternoon to have them done. Patient states she has enough medication for today and tomorrow. Patient advised her labs should be resulted tomorrow and we will be able to refill a 90 day supply. Patient expressed understanding.

## 2020-02-27 MED FILL — LEVOTHYROXINE SODIUM 25 MCG: 25 | 90 days supply | Qty: 90 | Fill #1

## 2020-02-27 MED FILL — PROGESTERONE 100 MG CAPS: 100 | 90 days supply | Qty: 90 | Fill #1

## 2020-02-27 MED FILL — NP THYROID 90 MG TABLET: 90 | 90 days supply | Qty: 90 | Fill #1

## 2020-02-28 LAB — CMP14+EGFR
ALT: 13 IU/L (ref 0–32)
AST: 17 IU/L (ref 0–40)
Albumin/Globulin Ratio: 1.3 (ref 1.2–2.2)
Albumin: 4.6 g/dL (ref 3.8–4.8)
Alkaline Phosphatase: 61 IU/L (ref 44–121)
BUN/Creatinine Ratio: 16 (ref 9–23)
BUN: 12 mg/dL (ref 6–24)
Bilirubin Total: 0.3 mg/dL (ref 0.0–1.2)
CO2: 25 mmol/L (ref 20–29)
Calcium: 9.4 mg/dL (ref 8.7–10.2)
Chloride: 100 mmol/L (ref 96–106)
Creatinine, Ser: 0.76 mg/dL (ref 0.57–1.00)
GFR calc Af Amer: 112 mL/min/{1.73_m2} (ref >59)
GFR calc non Af Amer: 97 mL/min/{1.73_m2} (ref >59)
Globulin, Total: 3.5 g/dL (ref 1.5–4.5)
Glucose: 100 mg/dL — ABNORMAL HIGH (ref 65–99)
Potassium: 3.9 mmol/L (ref 3.5–5.2)
Sodium: 139 mmol/L (ref 134–144)
Total Protein: 8.1 g/dL (ref 6.0–8.5)

## 2020-02-28 LAB — CBC WITH DIFFERENTIAL/PLATELET
Basophils Absolute: 0.1 10*3/uL (ref 0.0–0.2)
Basos: 1 %
EOS (ABSOLUTE): 0 10*3/uL (ref 0.0–0.4)
Eos: 0 %
Hematocrit: 41.7 % (ref 34.0–46.6)
Hemoglobin: 13.7 g/dL (ref 11.1–15.9)
Immature Grans (Abs): 0 10*3/uL (ref 0.0–0.1)
Immature Granulocytes: 0 %
Lymphocytes Absolute: 1.9 10*3/uL (ref 0.7–3.1)
Lymphs: 30 %
MCH: 27.9 pg (ref 26.6–33.0)
MCHC: 32.9 g/dL (ref 31.5–35.7)
MCV: 85 fL (ref 79–97)
Monocytes Absolute: 0.4 10*3/uL (ref 0.1–0.9)
Monocytes: 6 %
Neutrophils Absolute: 4 10*3/uL (ref 1.4–7.0)
Neutrophils: 63 %
Platelets: 317 10*3/uL (ref 150–450)
RBC: 4.91 x10E6/uL (ref 3.77–5.28)
RDW: 13 % (ref 11.7–15.4)
WBC: 6.3 10*3/uL (ref 3.4–10.8)

## 2020-02-28 NOTE — Progress Notes (Signed)
CBC and CMP WNL

## 2020-02-29 ENCOUNTER — Other Ambulatory Visit: Payer: Self-pay | Admitting: Physician Assistant

## 2020-02-29 ENCOUNTER — Other Ambulatory Visit: Payer: Self-pay | Admitting: Rheumatology

## 2020-02-29 DIAGNOSIS — M0579 Rheumatoid arthritis with rheumatoid factor of multiple sites without organ or systems involvement: Secondary | ICD-10-CM

## 2020-02-29 MED FILL — HYDROXYCHLOROQUINE 200 MG T: 200 | 84 days supply | Qty: 120 | Fill #0

## 2020-02-29 NOTE — Telephone Encounter (Signed)
Last Visit: 02/01/2020 Next Visit: 04/11/2020 Labs: 02/26/2020 CBC and CMP WNL Eye exam:  01/16/2020 normal  Current Dose per office note 02/01/2020: Plaquenil 200 mg 1 tablet by mouth twice daily Monday through Friday DX: Rheumatoid arthritis with rheumatoid factor of multiple sites without organ or systems involvement   Okay to refill per Dr. Estanislado Pandy

## 2020-03-28 NOTE — Progress Notes (Deleted)
Office Visit Note  Patient: Mariah Palmer             Date of Birth: May 06, 1975           MRN: [id number]             PCP: Harvie Heck, MD Referring: Harvie Heck, MD Visit Date: 04/11/2020 Occupation: @GUAROCC @  Subjective:  No chief complaint on file.   History of Present Illness: Mariah Palmer is a 44 y.o. female ***   Activities of Daily Living:  Patient reports morning stiffness for *** {minute/hour:19697}.   Patient {ACTIONS;DENIES/REPORTS:21021675::"Denies"} nocturnal pain.  Difficulty dressing/grooming: {ACTIONS;DENIES/REPORTS:21021675::"Denies"} Difficulty climbing stairs: {ACTIONS;DENIES/REPORTS:21021675::"Denies"} Difficulty getting out of chair: {ACTIONS;DENIES/REPORTS:21021675::"Denies"} Difficulty using hands for taps, buttons, cutlery, and/or writing: {ACTIONS;DENIES/REPORTS:21021675::"Denies"}  No Rheumatology ROS completed.   PMFS History:  Patient Active Problem List   Diagnosis Date Noted  . Rheumatoid arthritis involving multiple sites with positive rheumatoid factor ([facility]) 09/13/2017  . High risk medication use 09/13/2017  . Primary osteoarthritis of both hands 09/13/2017  . Primary osteoarthritis of both feet 09/13/2017  . Rheumatoid factor positive 08/24/2017  . Psoriasis 08/24/2017  . Acquired hypothyroidism 08/24/2017    Past Medical History:  Diagnosis Date  . Psoriasis     Family History  Problem Relation Age of Onset  . Prostate cancer Father   . Heart disease Father    Past Surgical History:  Procedure Laterality Date  . DILATION AND CURETTAGE OF UTERUS    . TONSILLECTOMY AND ADENOIDECTOMY     Social History   Social History Narrative  . Not on file   Immunization History  Administered Date(s) Administered  . PFIZER SARS-COV-2 Vaccination 04/24/2019, 05/16/2019     Objective: Vital Signs: There were no vitals taken for this visit.   Physical Exam   Musculoskeletal Exam: ***  CDAI Exam: CDAI Score: -- Patient  Global: --; Provider Global: -- Swollen: --; Tender: -- Joint Exam 04/11/2020   No joint exam has been documented for this visit   There is currently no information documented on the homunculus. Go to the Rheumatology activity and complete the homunculus joint exam.  Investigation: No additional findings.  Imaging: No results found.  Recent Labs: Lab Results  Component Value Date   WBC 6.3 02/26/2020   HGB 13.7 02/26/2020   PLT 317 02/26/2020   NA 139 02/26/2020   K 3.9 02/26/2020   CL 100 02/26/2020   CO2 25 02/26/2020   GLUCOSE 100 (H) 02/26/2020   BUN 12 02/26/2020   CREATININE 0.76 02/26/2020   BILITOT 0.3 02/26/2020   ALKPHOS 61 02/26/2020   AST 17 02/26/2020   ALT 13 02/26/2020   PROT 8.1 02/26/2020   ALBUMIN 4.6 02/26/2020   CALCIUM 9.4 02/26/2020   GFRAA 112 02/26/2020   QFTBGOLDPLUS NEGATIVE 08/10/2019    Speciality Comments: Comprehensive eye exam 01/16/2020 normal - performed at [facility], follow up in 6 months   Procedures:  No procedures performed Allergies: Patient has no known allergies.   Assessment / Plan:     Visit Diagnoses: No diagnosis found.  Orders: No orders of the defined types were placed in this encounter.  No orders of the defined types were placed in this encounter.   Face-to-face time spent with patient was *** minutes. Greater than 50% of time was spent in counseling and coordination of care.  Follow-Up Instructions: No follow-ups on file.   [facility], CMA  Note - This record has been created using  Dragon software.  Chart creation errors have been sought, but may not always  have been located. Such creation errors do not reflect on  the standard of medical care. 

## 2020-04-11 ENCOUNTER — Ambulatory Visit: Payer: 59 | Admitting: Rheumatology

## 2020-04-17 ENCOUNTER — Ambulatory Visit: Payer: 59 | Admitting: Medical-Surgical

## 2020-05-02 NOTE — Progress Notes (Signed)
Office Visit Note  Patient: Mariah Palmer             Date of Birth: [birth year]           MRN: [id number]             PCP: Samuel Bouche, NP Referring: Hermine Messick, MD Visit Date: 05/16/2020 Occupation: @GUAROCC @  Subjective:  Medication management and psoriasis.   History of Present Illness: Annalayah Trumble is a 45 y.o. female with history of seropositive erosive rheumatoid arthritis.  She states she is doing much better since she has been taking Plaquenil on a regular basis.  She states after 3 months of Plaquenil use her symptoms a started getting better.  She denies any morning stiffness, joint pain or joint swelling.  She has noticed increased psoriasis lesions lately.  She states she has noticed psoriasis in her ear canal around her scalp and her elbows.  She is not using any topical agents currently.  Activities of Daily Living:  Patient reports morning stiffness for 0 minute.   Patient Denies nocturnal pain.  Difficulty dressing/grooming: Denies Difficulty climbing stairs: Denies Difficulty getting out of chair: Denies Difficulty using hands for taps, buttons, cutlery, and/or writing: Denies  Review of Systems  Constitutional: Negative for fatigue, night sweats, weight gain and weight loss.  HENT: Negative for mouth sores, trouble swallowing, trouble swallowing, mouth dryness and nose dryness.   Eyes: Negative for pain, redness, visual disturbance and dryness.  Respiratory: Negative for cough, shortness of breath and difficulty breathing.   Cardiovascular: Negative for chest pain, palpitations, hypertension, irregular heartbeat and swelling in legs/feet.  Gastrointestinal: Negative for blood in stool, constipation and diarrhea.  Endocrine: Negative for increased urination.  Genitourinary: Negative for vaginal dryness.  Musculoskeletal: Negative for arthralgias, joint pain, joint swelling, myalgias, muscle weakness, morning stiffness, muscle tenderness and myalgias.  Skin:  Negative for color change, rash, hair loss, skin tightness, ulcers and sensitivity to sunlight.  Allergic/Immunologic: Negative for susceptible to infections.  Neurological: Negative for dizziness, memory loss, night sweats and weakness.  Hematological: Negative for swollen glands.  Psychiatric/Behavioral: Negative for depressed mood and sleep disturbance. The patient is not nervous/anxious.     PMFS History:  Patient Active Problem List   Diagnosis Date Noted  . Irregular menses 05/03/2020  . Snoring 05/03/2020  . Rheumatoid arthritis involving multiple sites with positive rheumatoid factor ([place]) 09/13/2017  . High risk medication use 09/13/2017  . Primary osteoarthritis of both hands 09/13/2017  . Primary osteoarthritis of both feet 09/13/2017  . Rheumatoid factor positive 08/24/2017  . Psoriasis 10/24/2016  . Seasonal allergic rhinitis due to pollen 12/09/2015  . Acquired hypothyroidism 02/03/2012  . Alopecia areata 02/03/2012    Past Medical History:  Diagnosis Date  . Psoriasis   . Rheumatoid arthritis ([place])   . Thyroid disease     Family History  Problem Relation Age of Onset  . Prostate cancer Father   . Heart disease Father    Past Surgical History:  Procedure Laterality Date  . DILATION AND CURETTAGE OF UTERUS    . TONSILLECTOMY AND ADENOIDECTOMY     Social History   Social History Narrative  . Not on file   Immunization History  Administered Date(s) Administered  . Influenza-Unspecified 02/03/2012, 01/12/2019, 01/12/2020  . PFIZER(Purple Top)SARS-COV-2 Vaccination 04/24/2019, 05/16/2019  . Tdap 02/03/2007, 05/03/2020     Objective: Vital Signs: BP 114/74 (BP Location: Right Arm, Patient Position: Sitting, Cuff Size: Normal)   Pulse 75  Resp 13   Ht 5\' 5"  (1.651 m)   Wt 148 lb (67.1 kg)   LMP 04/28/2020   BMI 24.63 kg/m    Physical Exam Vitals and nursing note reviewed.  Constitutional:      Appearance: She is well-developed and well-nourished.   HENT:     Head: Normocephalic and atraumatic.  Eyes:     Extraocular Movements: EOM normal.     Conjunctiva/sclera: Conjunctivae normal.  Cardiovascular:     Rate and Rhythm: Normal rate and regular rhythm.     Pulses: Intact distal pulses.     Heart sounds: Normal heart sounds.  Pulmonary:     Effort: Pulmonary effort is normal.     Breath sounds: Normal breath sounds.  Abdominal:     General: Bowel sounds are normal.     Palpations: Abdomen is soft.  Musculoskeletal:     Cervical back: Normal range of motion.  Lymphadenopathy:     Cervical: No cervical adenopathy.  Skin:    General: Skin is warm and dry.     Capillary Refill: Capillary refill takes less than 2 seconds.     Comments: Psoriasis patches was noted around the nape of the neck around the ear pinna and in the ear canals.  She also has psoriasis over her elbows.  Neurological:     Mental Status: She is alert and oriented to person, place, and time.  Psychiatric:        Mood and Affect: Mood and affect normal.        Behavior: Behavior normal.      Musculoskeletal Exam: C-spine was in good range of motion.  Shoulder joints, elbow joints, wrist joints with good range of motion.  She had tenderness over wrist joints or MCPs.  She synovial thickening over bilateral second and third MCP joints.  Hip joints and knee joints with good range of motion.  There was no tenderness over MTPs or PIPs.  CDAI Exam: CDAI Score: 0.3  Patient Global: 1 mm; Provider Global: 2 mm Swollen: 0 ; Tender: 0  Joint Exam 05/16/2020   No joint exam has been documented for this visit   There is currently no information documented on the homunculus. Go to the Rheumatology activity and complete the homunculus joint exam.  Investigation: No additional findings.  Imaging: No results found.  Recent Labs: Lab Results  Component Value Date   WBC 6.3 02/26/2020   HGB 13.7 02/26/2020   PLT 317 02/26/2020   NA 139 02/26/2020   K 3.9  02/26/2020   CL 100 02/26/2020   CO2 25 02/26/2020   GLUCOSE 100 (H) 02/26/2020   BUN 12 02/26/2020   CREATININE 0.76 02/26/2020   BILITOT 0.3 02/26/2020   ALKPHOS 61 02/26/2020   AST 17 02/26/2020   ALT 13 02/26/2020   PROT 8.1 02/26/2020   ALBUMIN 4.6 02/26/2020   CALCIUM 9.4 02/26/2020   GFRAA 112 02/26/2020   QFTBGOLDPLUS NEGATIVE 08/10/2019    Speciality Comments: Comprehensive eye exam 01/16/2020 normal - performed at [facility], [place], follow up in 6 months   Procedures:  No procedures performed Allergies: Patient has no known allergies.   Assessment / Plan:     Visit Diagnoses: Rheumatoid arthritis with rheumatoid factor of multiple sites without organ or systems involvement (HCC) - Erosive rheumatoid arthritis: She has synovial thickening over MCP joints but no active synovitis was noted.  She states she has no joint pain or morning stiffness.  I reviewed her wrist  x-rays from May 2021 today which showed erosive changes.  Ideally she should be on methotrexate or a biologic agent.  She is hesitant to go on any of those medications at this point.  She does not like subcutaneous injections.  I discussed the option of either Simponi which is once a month or Cimzia which could be an office injection.  She would like to wait until the next visit when we repeat her x-rays.  High risk medication use - Plaquenil 200 mg 1 tablet by mouth twice daily Monday through Friday. Comprehensive eye exam 01/16/2020 normal  - Plan: CBC with Differential/Platelet, COMPLETE METABOLIC PANEL WITH GFR today and then every 5 months to monitor for drug toxicity.  Primary osteoarthritis of both hands-she currently does not have much stiffness.  Primary osteoarthritis of both feet-she denies any discomfort.  Psoriasis-she is having a flare of psoriasis over her elbows, on her scalp and ear canals.  I gave her a prescription of topical clobetasol solution which can be used.  I advised her to  schedule an appoint with a dermatologist.  I also discussed that Plaquenil can make the psoriasis worse.  Acquired hypothyroidism  Educated about COVID-19 virus infection -patient had initial 2 vaccines for COVID-19.  She does not wish to have further immunization.  Use of mask, social distancing and hand hygiene was discussed.    Orders: Orders Placed This Encounter  Procedures  . CBC with Differential/Platelet  . COMPLETE METABOLIC PANEL WITH GFR   Meds ordered this encounter  Medications  . clobetasol (TEMOVATE) 0.05 % external solution    Sig: Apply 1 application topically 2 (two) times daily.    Dispense:  50 mL    Refill:  1      Follow-Up Instructions: Return in about 5 months (around 10/13/2020) for Rheumatoid arthritis, Osteoarthritis.   Bo Merino, MD  Note - This record has been created using Editor, commissioning.  Chart creation errors have been sought, but may not always  have been located. Such creation errors do not reflect on  the standard of medical care.

## 2020-05-03 ENCOUNTER — Other Ambulatory Visit: Payer: Self-pay

## 2020-05-03 ENCOUNTER — Encounter: Payer: Self-pay | Admitting: Medical-Surgical

## 2020-05-03 ENCOUNTER — Ambulatory Visit (INDEPENDENT_AMBULATORY_CARE_PROVIDER_SITE_OTHER): Payer: 59 | Admitting: Medical-Surgical

## 2020-05-03 VITALS — BP 106/72 | HR 80 | Temp 98.7°F | Ht 65.5 in | Wt 153.6 lb

## 2020-05-03 DIAGNOSIS — Z23 Encounter for immunization: Secondary | ICD-10-CM | POA: Diagnosis not present

## 2020-05-03 DIAGNOSIS — E039 Hypothyroidism, unspecified: Secondary | ICD-10-CM

## 2020-05-03 DIAGNOSIS — M0579 Rheumatoid arthritis with rheumatoid factor of multiple sites without organ or systems involvement: Secondary | ICD-10-CM | POA: Diagnosis not present

## 2020-05-03 DIAGNOSIS — R0683 Snoring: Secondary | ICD-10-CM | POA: Insufficient documentation

## 2020-05-03 DIAGNOSIS — Z7689 Persons encountering health services in other specified circumstances: Secondary | ICD-10-CM

## 2020-05-03 DIAGNOSIS — Z1231 Encounter for screening mammogram for malignant neoplasm of breast: Secondary | ICD-10-CM

## 2020-05-03 DIAGNOSIS — J301 Allergic rhinitis due to pollen: Secondary | ICD-10-CM | POA: Diagnosis not present

## 2020-05-03 DIAGNOSIS — N926 Irregular menstruation, unspecified: Secondary | ICD-10-CM | POA: Diagnosis not present

## 2020-05-03 DIAGNOSIS — L409 Psoriasis, unspecified: Secondary | ICD-10-CM

## 2020-05-03 NOTE — Patient Instructions (Signed)
Tdap (Tetanus, Diphtheria, Pertussis) Vaccine: What You Need to Know 1. Why get vaccinated? Tdap vaccine can prevent tetanus, diphtheria, and pertussis. Diphtheria and pertussis spread from person to person. Tetanus enters the body through cuts or wounds.  TETANUS (T) causes painful stiffening of the muscles. Tetanus can lead to serious health problems, including being unable to open the mouth, having trouble swallowing and breathing, or death.  DIPHTHERIA (D) can lead to difficulty breathing, heart failure, paralysis, or death.  PERTUSSIS (aP), also known as "whooping cough," can cause uncontrollable, violent coughing that makes it hard to breathe, eat, or drink. Pertussis can be extremely serious especially in babies and young children, causing pneumonia, convulsions, brain damage, or death. In teens and adults, it can cause weight loss, loss of bladder control, passing out, and rib fractures from severe coughing. 2. Tdap vaccine Tdap is only for children 7 years and older, adolescents, and adults.  Adolescents should receive a single dose of Tdap, preferably at age 11 or 12 years. Pregnant people should get a dose of Tdap during every pregnancy, preferably during the early part of the third trimester, to help protect the newborn from pertussis. Infants are most at risk for severe, life-threatening complications from pertussis. Adults who have never received Tdap should get a dose of Tdap. Also, adults should receive a booster dose of either Tdap or Td (a different vaccine that protects against tetanus and diphtheria but not pertussis) every 10 years, or after 5 years in the case of a severe or dirty wound or burn. Tdap may be given at the same time as other vaccines. 3. Talk with your health care provider Tell your vaccine provider if the person getting the vaccine:  Has had an allergic reaction after a previous dose of any vaccine that protects against tetanus, diphtheria, or pertussis, or  has any severe, life-threatening allergies  Has had a coma, decreased level of consciousness, or prolonged seizures within 7 days after a previous dose of any pertussis vaccine (DTP, DTaP, or Tdap)  Has seizures or another nervous system problem  Has ever had Guillain-Barr Syndrome (also called "GBS")  Has had severe pain or swelling after a previous dose of any vaccine that protects against tetanus or diphtheria In some cases, your health care provider may decide to postpone Tdap vaccination until a future visit. People with minor illnesses, such as a cold, may be vaccinated. People who are moderately or severely ill should usually wait until they recover before getting Tdap vaccine.  Your health care provider can give you more information. 4. Risks of a vaccine reaction  Pain, redness, or swelling where the shot was given, mild fever, headache, feeling tired, and nausea, vomiting, diarrhea, or stomachache sometimes happen after Tdap vaccination. People sometimes faint after medical procedures, including vaccination. Tell your provider if you feel dizzy or have vision changes or ringing in the ears.  As with any medicine, there is a very remote chance of a vaccine causing a severe allergic reaction, other serious injury, or death. 5. What if there is a serious problem? An allergic reaction could occur after the vaccinated person leaves the clinic. If you see signs of a severe allergic reaction (hives, swelling of the face and throat, difficulty breathing, a fast heartbeat, dizziness, or weakness), call 9-1-1 and get the person to the nearest hospital. For other signs that concern you, call your health care provider.  Adverse reactions should be reported to the Vaccine Adverse Event Reporting System (VAERS). Your health   care provider will usually file this report, or you can do it yourself. Visit the VAERS website at www.vaers.hhs.gov or call 1-800-822-7967. VAERS is only for reporting  reactions, and VAERS staff members do not give medical advice. 6. The National Vaccine Injury Compensation Program The National Vaccine Injury Compensation Program (VICP) is a federal program that was created to compensate people who may have been injured by certain vaccines. Claims regarding alleged injury or death due to vaccination have a time limit for filing, which may be as short as two years. Visit the VICP website at www.hrsa.gov/vaccinecompensation or call 1-800-338-2382 to learn about the program and about filing a claim. 7. How can I learn more?  Ask your health care provider.  Call your local or state health department.  Visit the website of the Food and Drug Administration (FDA) for vaccine package inserts and additional information at www.fda.gov/vaccines-blood-biologics/vaccines.  Contact the Centers for Disease Control and Prevention (CDC): ? Call 1-800-232-4636 (1-800-CDC-INFO) or ? Visit CDC's website at www.cdc.gov/vaccines. Vaccine Information Statement Tdap (Tetanus, Diphtheria, Pertussis) Vaccine (11/03/2019) This information is not intended to replace advice given to you by your health care provider. Make sure you discuss any questions you have with your health care provider. Document Revised: 11/29/2019 Document Reviewed: 11/29/2019 Elsevier Patient Education  2021 Elsevier Inc.  

## 2020-05-03 NOTE — Progress Notes (Signed)
New Patient Office Visit  Subjective:  Patient ID: Mariah Palmer, female    DOB: [birth year]  Age: 45 y.o. MRN: [id number]  CC:  Chief Complaint  Patient presents with  . Establish Care    HPI Mariah Palmer presents to establish care.  Psoriasis-managed with emollients but does occasionally use triamcinolone cream that she has at home.  Has noticed a worsening of her psoriasis as she has gained more control over her rheumatoid arthritis.  RA-managed by rheumatology.  Taking hydroxychloroquine 200 mg twice daily, tolerating well.  Thyroid concerns-this is managed by [facility].  She is currently taking Armour Thyroid 90 mg daily in conjunction with Levoxyl 25 mcg daily.  Her last thyroid labs were checked in November with normal TSH.  Irregular menses-was previously evaluated for irregular menses.  Cycles used to be every 4 weeks but had begun to shortening to every 3 weeks.  She was started on progesterone 100 mg daily and has been taking this regularly.  Taking the progesterone, her cycles lengthened back to 4 weeks.  Unfortunately, they seem to be shortening again although her dosage of progesterone has not changed.  She wonders if this may be due to being perimenopausal.  Notes her mother had menopause at the age of 49 but is unsure about her grandmother.  Snoring-notes that she intermittently has loud snoring at night.  This is often in conjunction with sinus congestion.  Notes that she does not snore every night.  She is not waking herself snoring but is a bit concerned with what may be causing this  Past Medical History:  Diagnosis Date  . Psoriasis   . Rheumatoid arthritis ([place])   . Thyroid disease     Past Surgical History:  Procedure Laterality Date  . DILATION AND CURETTAGE OF UTERUS    . TONSILLECTOMY AND ADENOIDECTOMY      Family History  Problem Relation Age of Onset  . Prostate cancer Father   . Heart disease Father     Social History    Socioeconomic History  . Marital status: Married    Spouse name: Not on file  . Number of children: Not on file  . Years of education: Not on file  . Highest education level: Not on file  Occupational History  . Not on file  Tobacco Use  . Smoking status: Never Smoker  . Smokeless tobacco: Never Used  Vaping Use  . Vaping Use: Never used  Substance and Sexual Activity  . Alcohol use: Yes    Comment: occasionally  . Drug use: Never  . Sexual activity: Yes    Partners: Male    Birth control/protection: Condom  Other Topics Concern  . Not on file  Social History Narrative  . Not on file   Social Determinants of Health   Financial Resource Strain: Not on file  Food Insecurity: Not on file  Transportation Needs: Not on file  Physical Activity: Not on file  Stress: Not on file  Social Connections: Not on file  Intimate Partner Violence: Not on file    ROS Review of Systems  Constitutional: Positive for unexpected weight change. Negative for chills, fatigue and fever.  Respiratory: Negative for cough, chest tightness, shortness of breath and wheezing.   Cardiovascular: Negative for chest pain, palpitations and leg swelling.  Genitourinary: Positive for menstrual problem.  Neurological: Negative for dizziness, light-headedness and headaches.  Psychiatric/Behavioral: Negative for dysphoric mood, self-injury, sleep disturbance and suicidal ideas. The patient is not nervous/anxious.  Objective:   Today's Vitals: BP 106/72   Pulse 80   Temp 98.7 F (37.1 C)   Ht 5' 5.5" (1.664 m)   Wt 153 lb 9.6 oz (69.7 kg)   LMP 04/28/2020   SpO2 99%   BMI 25.17 kg/m   Physical Exam Vitals reviewed.  Constitutional:      General: She is not in acute distress.    Appearance: Normal appearance. She is normal weight. She is not ill-appearing.  HENT:     Head: Normocephalic and atraumatic.  Cardiovascular:     Rate and Rhythm: Normal rate and regular rhythm.     Pulses:  Normal pulses.     Heart sounds: Normal heart sounds. No murmur heard. No friction rub. No gallop.   Pulmonary:     Effort: Pulmonary effort is normal. No respiratory distress.     Breath sounds: Normal breath sounds. No wheezing.  Skin:    General: Skin is warm and dry.  Neurological:     Mental Status: She is alert and oriented to person, place, and time.  Psychiatric:        Mood and Affect: Mood normal.        Behavior: Behavior normal.        Thought Content: Thought content normal.        Judgment: Judgment normal.    Assessment & Plan:   1. Encounter to establish care Reviewed available information and discussed healthcare concerns with patient.  We are sending over to [facility] to get her most recent lab work for upload and there are chart.  She would like to continue seeing them for some of her management.  Advised patient this is fine with me just make sure that she updates [place] on any changes that are made.  2. Acquired hypothyroidism Currently managed with Armour Thyroid 90 mg and Levoxyl 25 mcg daily.  Not due for lab rechecks at this time.  3. Snoring Feel this is likely related to her sinus congestion, quite possibly from allergic rhinitis.  Recommend daily Flonase and consider adding in a daily oral antihistamine to see if this is helpful.  Discussed sleep apnea symptoms, reassured patient that her symptoms are not consistent with sleep apnea at this time but if they worsen or continue to be bothersome, we can always look into a sleep study.  4. Psoriasis Stable although a bit worse since starting Plaquenil.  Continue emollients and triamcinolone cream as needed.  5. Rheumatoid arthritis involving multiple sites with positive rheumatoid factor ([place]) Managed by rheumatology.  6. Seasonal allergic rhinitis due to pollen Recommend daily Flonase and then oral antihistamine as this may help tremendously.  7. Irregular menses Discussed the role of checking  hormones in the perimenopausal stage.  With her mother having menopause at age 18, patient is in the perimenopausal stage.  Her cycle changes may be related to fluctuating hormonal levels.  For now, plan to continue progesterone 100 mg daily.  This will be rechecked by [facility].  8. Need for Tdap vaccination Tdap vaccine given in office today. - Tdap vaccine greater than or equal to 7yo IM  9. Encounter for screening mammogram for malignant neoplasm of breast Mammogram ordered. - MM 3D SCREEN BREAST BILATERAL; Future   Outpatient Encounter Medications as of 05/03/2020  Medication Sig  . ARMOUR THYROID 90 MG tablet BREAK ONE TABLET WITH TEETH AND DISSOLVE UNDER TONGUE IN AM DAILY, MAY EAT IN 15 MINUTES  . hydroxychloroquine (PLAQUENIL) 200 MG  tablet TAKE 1 TABLET BY MOUTH TWO TIMES DAILY MONDAY THROUGH FRIDAY  . LEVOXYL 25 MCG tablet Take 25 mcg by mouth daily.  . Multiple Vitamins-Calcium (ONE-A-DAY WOMENS PO) Take by mouth.  . Probiotic Product (PROBIOTIC PO) Take by mouth daily.  . progesterone (PROMETRIUM) 100 MG capsule Take 100 mg by mouth at bedtime.  . fluticasone (FLONASE) 50 MCG/ACT nasal spray Place into the nose.   No facility-administered encounter medications on file as of 05/03/2020.    Follow-up: Return for annual physical exam at your convenience.   Clearnce Sorrel, DNP, APRN, FNP-BC [facility]

## 2020-05-16 ENCOUNTER — Other Ambulatory Visit: Payer: Self-pay | Admitting: Rheumatology

## 2020-05-16 ENCOUNTER — Ambulatory Visit: Payer: 59 | Admitting: Rheumatology

## 2020-05-16 ENCOUNTER — Other Ambulatory Visit: Payer: Self-pay

## 2020-05-16 ENCOUNTER — Encounter: Payer: Self-pay | Admitting: Rheumatology

## 2020-05-16 VITALS — BP 114/74 | HR 75 | Resp 13 | Ht 65.0 in | Wt 148.0 lb

## 2020-05-16 DIAGNOSIS — M19042 Primary osteoarthritis, left hand: Secondary | ICD-10-CM

## 2020-05-16 DIAGNOSIS — Z79899 Other long term (current) drug therapy: Secondary | ICD-10-CM | POA: Diagnosis not present

## 2020-05-16 DIAGNOSIS — M19071 Primary osteoarthritis, right ankle and foot: Secondary | ICD-10-CM | POA: Diagnosis not present

## 2020-05-16 DIAGNOSIS — M19041 Primary osteoarthritis, right hand: Secondary | ICD-10-CM | POA: Diagnosis not present

## 2020-05-16 DIAGNOSIS — L409 Psoriasis, unspecified: Secondary | ICD-10-CM

## 2020-05-16 DIAGNOSIS — M19072 Primary osteoarthritis, left ankle and foot: Secondary | ICD-10-CM | POA: Diagnosis not present

## 2020-05-16 DIAGNOSIS — Z7189 Other specified counseling: Secondary | ICD-10-CM | POA: Diagnosis not present

## 2020-05-16 DIAGNOSIS — E039 Hypothyroidism, unspecified: Secondary | ICD-10-CM | POA: Diagnosis not present

## 2020-05-16 DIAGNOSIS — M0579 Rheumatoid arthritis with rheumatoid factor of multiple sites without organ or systems involvement: Secondary | ICD-10-CM

## 2020-05-16 MED ORDER — CLOBETASOL PROPIONATE 0.05 % EX SOLN: 1.0000 "application " | Freq: Two times a day (BID) | CUTANEOUS | 1 refills | Status: DC

## 2020-05-16 MED FILL — CLOBETASOL 0.05% SOLUTION: 0.05 | 30 days supply | Qty: 50 | Fill #0

## 2020-05-16 NOTE — Patient Instructions (Signed)
Standing Labs We placed an order today for your standing lab work.   Please have your standing labs drawn in July  If possible, please have your labs drawn 2 weeks prior to your appointment so that the provider can discuss your results at your appointment.  We have open lab daily Monday through Thursday from 1:30-4:30 PM and Friday from 1:30-4:00 PM at the office of Dr. Bo Merino, [facility].   Please be advised, all patients with office appointments requiring lab work will take precedents over walk-in lab work.  If possible, please come for your lab work on Monday and Friday afternoons, as you may experience shorter wait times. The office is located at [address] [place], [place], [place] [address] No appointment is necessary.   Labs are drawn by [facility]. Please bring your co-pay at the time of your lab draw.  You may receive a bill from [facility] for your lab work.  If you wish to have your labs drawn at another location, please call the office 24 hours in advance to send orders.  If you have any questions regarding directions or hours of operation,  please call [phone].   As a reminder, please drink plenty of water prior to coming for your lab work. Thanks! COVID-19 vaccine recommendations:   COVID-19 vaccine is recommended for everyone (unless you are allergic to a vaccine component), even if you are on a medication that suppresses your immune system.   For individuals on immunosuppressive agents total 3 doses of COVID-19 vaccination are recommended 1 month apart and then a booster (fourth dose) 6 months later.  Do not take Tylenol or any anti-inflammatory medications (NSAIDs) 24 hours prior to the COVID-19 vaccination.   There is no direct evidence about the efficacy of the COVID-19 vaccine in individuals who are on medications that suppress the immune system.   Even if you are fully vaccinated, and you are on any medications that suppress your immune  system, please continue to wear a mask, maintain at least six feet social distance and practice hand hygiene.   If you develop a COVID-19 infection, please contact your PCP or our office to determine if you need monoclonal antibody infusion.  The booster vaccine is now available for immunocompromised patients.   Please see the following web sites for updated information.   [email or web address]

## 2020-05-17 LAB — CBC WITH DIFFERENTIAL/PLATELET
Absolute Monocytes: 554 cells/uL (ref 200–950)
Basophils Absolute: 77 cells/uL (ref 0–200)
Basophils Relative: 1 %
Eosinophils Absolute: 131 cells/uL (ref 15–500)
Eosinophils Relative: 1.7 %
HCT: 40.3 % (ref 35.0–45.0)
Hemoglobin: 13.6 g/dL (ref 11.7–15.5)
Lymphs Abs: 1679 cells/uL (ref 850–3900)
MCH: 28.3 pg (ref 27.0–33.0)
MCHC: 33.7 g/dL (ref 32.0–36.0)
MCV: 83.8 fL (ref 80.0–100.0)
MPV: 9.4 fL (ref 7.5–12.5)
Monocytes Relative: 7.2 %
Neutro Abs: 5259 cells/uL (ref 1500–7800)
Neutrophils Relative %: 68.3 %
Platelets: 294 10*3/uL (ref 140–400)
RBC: 4.81 10*6/uL (ref 3.80–5.10)
RDW: 13.4 % (ref 11.0–15.0)
Total Lymphocyte: 21.8 %
WBC: 7.7 10*3/uL (ref 3.8–10.8)

## 2020-05-17 LAB — COMPLETE METABOLIC PANEL WITH GFR
AG Ratio: 1.7 (calc) (ref 1.0–2.5)
ALT: 12 U/L (ref 6–29)
AST: 14 U/L (ref 10–30)
Albumin: 4.8 g/dL (ref 3.6–5.1)
Alkaline phosphatase (APISO): 49 U/L (ref 31–125)
BUN: 13 mg/dL (ref 7–25)
CO2: 27 mmol/L (ref 20–32)
Calcium: 9.6 mg/dL (ref 8.6–10.2)
Chloride: 102 mmol/L (ref 98–110)
Creat: 0.78 mg/dL (ref 0.50–1.10)
GFR, Est African American: 109 mL/min/{1.73_m2} (ref >=60)
GFR, Est Non African American: 94 mL/min/{1.73_m2} (ref >=60)
Globulin: 2.8 g/dL (calc) (ref 1.9–3.7)
Glucose, Bld: 88 mg/dL (ref 65–99)
Potassium: 4.3 mmol/L (ref 3.5–5.3)
Sodium: 137 mmol/L (ref 135–146)
Total Bilirubin: 0.5 mg/dL (ref 0.2–1.2)
Total Protein: 7.6 g/dL (ref 6.1–8.1)

## 2020-05-17 NOTE — Progress Notes (Signed)
CBC and CMP with GFR normal.

## 2020-05-27 ENCOUNTER — Other Ambulatory Visit: Payer: Self-pay | Admitting: Physician Assistant

## 2020-05-27 ENCOUNTER — Other Ambulatory Visit: Payer: Self-pay | Admitting: Rheumatology

## 2020-05-27 DIAGNOSIS — M0579 Rheumatoid arthritis with rheumatoid factor of multiple sites without organ or systems involvement: Secondary | ICD-10-CM

## 2020-05-27 MED FILL — PROGESTERONE 100 MG CAPS: 100 | 90 days supply | Qty: 90 | Fill #2

## 2020-05-27 MED FILL — HYDROXYCHLOROQUINE 200 MG T: 200 | 84 days supply | Qty: 120 | Fill #0

## 2020-05-27 MED FILL — NP THYROID 90 MG TABLET: 90 | 90 days supply | Qty: 90 | Fill #2

## 2020-05-27 MED FILL — LEVOTHYROXINE SODIUM 25 MCG: 25 | 90 days supply | Qty: 90 | Fill #2

## 2020-05-27 NOTE — Telephone Encounter (Signed)
RX faxed from [facility] Visit: 05/16/2020 Next Visit: 10/17/2020 Labs: 05/16/2020, CBC and CMP with GFR normal. Eye exam: 01/16/2020 f/u 6 months  Current Dose per office note 05/16/2020, Plaquenil 200 mg 1 tablet by mouth twice daily Monday through Friday DX: Rheumatoid arthritis with rheumatoid factor of multiple sites without organ or systems involvement   Last Fill: 02/29/2020  Okay to refill Plaquenil?

## 2020-05-29 ENCOUNTER — Other Ambulatory Visit: Payer: Self-pay

## 2020-05-29 ENCOUNTER — Ambulatory Visit (INDEPENDENT_AMBULATORY_CARE_PROVIDER_SITE_OTHER): Payer: 59

## 2020-05-29 DIAGNOSIS — Z1231 Encounter for screening mammogram for malignant neoplasm of breast: Secondary | ICD-10-CM | POA: Diagnosis not present

## 2020-06-12 ENCOUNTER — Ambulatory Visit: Payer: 59

## 2020-06-12 ENCOUNTER — Encounter: Payer: Self-pay | Admitting: Medical-Surgical

## 2020-06-12 ENCOUNTER — Other Ambulatory Visit: Payer: Self-pay | Admitting: Medical-Surgical

## 2020-06-12 MED ORDER — FLUTICASONE PROPIONATE 50 MCG/ACT NA SUSP: 2.0000 | NASAL | 3 refills | Status: DC | PRN

## 2020-08-26 ENCOUNTER — Other Ambulatory Visit: Payer: Self-pay | Admitting: Physician Assistant

## 2020-08-26 DIAGNOSIS — M0579 Rheumatoid arthritis with rheumatoid factor of multiple sites without organ or systems involvement: Secondary | ICD-10-CM

## 2020-08-27 ENCOUNTER — Other Ambulatory Visit (HOSPITAL_COMMUNITY): Payer: Self-pay

## 2020-08-27 MED ORDER — HYDROXYCHLOROQUINE SULFATE 200 MG PO TABS
ORAL_TABLET | ORAL | 0 refills | Status: DC
  Filled 2020-08-27: qty 120, 84d supply, fill #0

## 2020-08-27 NOTE — Telephone Encounter (Signed)
Last Visit: 05/16/2020 Next Visit: 10/17/2020 Labs:  05/16/2020, CBC and CMP with GFR normal. Eye exam: 01/16/2020 normal  Current Dose per office note 05/16/2020, Plaquenil 200 mg 1 tablet by mouth twice daily Monday through Friday [id number] arthritis with rheumatoid factor of multiple sites without organ or systems involvement   Last Fill: 05/27/2020  Okay to refill per Dr. Estanislado Pandy

## 2020-10-01 ENCOUNTER — Other Ambulatory Visit (HOSPITAL_COMMUNITY): Payer: Self-pay

## 2020-10-01 MED FILL — Levothyroxine Sodium Tab 25 MCG: ORAL | 90 days supply | Qty: 90 | Fill #0 | Status: AC

## 2020-10-01 MED FILL — Thyroid Tab 90 MG (1 1/2 Grain): ORAL | 90 days supply | Qty: 90 | Fill #0 | Status: AC

## 2020-10-01 MED FILL — Progesterone Cap 100 MG: ORAL | 90 days supply | Qty: 90 | Fill #0 | Status: AC

## 2020-10-03 NOTE — Progress Notes (Signed)
Office Visit Note  Patient: Mariah Palmer             Date of Birth: 1975-10-28           MRN: [id number]             PCP: Samuel Bouche, NP Referring: Samuel Bouche, NP Visit Date: 10/17/2020 Occupation: @GUAROCC @  Subjective:  Medication monitoring  History of Present Illness: Mariah Palmer is a 45 y.o. female with history of seropositive rheumatoid arthritis and osteoarthritis.  She is taking Plaquenil 200 mg 1 tablet by mouth twice daily Monday through Friday.  She continues to tolerate Plaquenil without any side effects.  She reports that she has occasional pain and inflammation in her right wrist and first MCP joint but overall her symptoms have been tolerable.  She has noticed significant improvement while taking Plaquenil.  She continues to note on a regular basis and has had some increased discomfort in her DIP joints but has not noticed any inflammation.  She denies any joint swelling at this time.  She continues to have some psoriasis on her elbows but denies any new patches.  She uses clobetasol cream topically as needed as well as Vaseline as a moisturizer. She denies any recent infections.   .   Activities of Daily Living:  Patient reports morning stiffness for couple hours  Patient Denies nocturnal pain.  Difficulty dressing/grooming: Denies Difficulty climbing stairs: Denies Difficulty getting out of chair: Denies Difficulty using hands for taps, buttons, cutlery, and/or writing: Denies  Review of Systems  Constitutional:  Positive for fatigue.  HENT:  Negative for mouth sores, mouth dryness and nose dryness.   Eyes:  Negative for pain, visual disturbance and dryness.  Respiratory:  Negative for cough, hemoptysis, shortness of breath and difficulty breathing.   Cardiovascular:  Negative for chest pain, palpitations, hypertension and swelling in legs/feet.  Gastrointestinal:  Negative for blood in stool, constipation and diarrhea.  Endocrine: Negative for increased  urination.  Genitourinary:  Negative for painful urination.  Musculoskeletal:  Positive for joint pain, joint pain and morning stiffness. Negative for joint swelling, myalgias, muscle weakness, muscle tenderness and myalgias.  Skin:  Positive for rash (Psoriasis). Negative for color change, pallor, hair loss, nodules/bumps, skin tightness, ulcers and sensitivity to sunlight.  Allergic/Immunologic: Negative for susceptible to infections.  Neurological:  Negative for dizziness, numbness, headaches and weakness.  Hematological:  Negative for swollen glands.  Psychiatric/Behavioral:  Negative for depressed mood and sleep disturbance. The patient is not nervous/anxious.    PMFS History:  Patient Active Problem List   Diagnosis Date Noted   Irregular menses 05/03/2020   Snoring 05/03/2020   Rheumatoid arthritis involving multiple sites with positive rheumatoid factor ([place]) 09/13/2017   High risk medication use 09/13/2017   Primary osteoarthritis of both hands 09/13/2017   Primary osteoarthritis of both feet 09/13/2017   Rheumatoid factor positive 08/24/2017   Psoriasis 10/24/2016   Seasonal allergic rhinitis due to pollen 12/09/2015   Acquired hypothyroidism 02/03/2012   Alopecia areata 02/03/2012    Past Medical History:  Diagnosis Date   Psoriasis    Rheumatoid arthritis ([place])    Thyroid disease     Family History  Problem Relation Age of Onset   Prostate cancer Father    Heart disease Father    Breast cancer Paternal Aunt    Past Surgical History:  Procedure Laterality Date   DILATION AND CURETTAGE OF UTERUS     TONSILLECTOMY AND ADENOIDECTOMY  Social History   Social History Narrative   Not on file   Immunization History  Administered Date(s) Administered   Influenza-Unspecified 02/03/2012, 01/12/2019, 01/12/2020   PFIZER(Purple Top)SARS-COV-2 Vaccination 04/24/2019, 05/16/2019   Tdap 02/03/2007, 05/03/2020     Objective: Vital Signs: BP 130/83 (BP Location:  Left Arm, Patient Position: Sitting, Cuff Size: Normal)   Pulse 84   Ht 5\' 5"  (1.651 m)   Wt 149 lb 12.8 oz (67.9 kg)   BMI 24.93 kg/m    Physical Exam Vitals and nursing note reviewed.  Constitutional:      Appearance: She is well-developed.  HENT:     Head: Normocephalic and atraumatic.  Eyes:     Conjunctiva/sclera: Conjunctivae normal.  Pulmonary:     Effort: Pulmonary effort is normal.  Abdominal:     Palpations: Abdomen is soft.  Musculoskeletal:     Cervical back: Normal range of motion.  Skin:    General: Skin is warm and dry.     Capillary Refill: Capillary refill takes less than 2 seconds.  Neurological:     Mental Status: She is alert and oriented to person, place, and time.  Psychiatric:        Behavior: Behavior normal.     Musculoskeletal Exam: C-spine, thoracic spine, lumbar spine have good range of motion with no discomfort.  Shoulder joints and elbow joints have good range of motion with no discomfort.  Tenderness and synovitis over the ulnar aspect of the right wrist noted.  Tenderness and synovitis of the right first MCP joint.  PIP and DIP thickening consistent with osteoarthritis of both hands noted.  Complete fist formation bilaterally.  Hip joints have good range of motion with no discomfort.  Knee joints have good range of motion with no warmth or effusion.  Warmth and synovial thickening of the left ankle noted.  No tenderness over MTP joints.  CDAI Exam: CDAI Score: 0.2  Patient Global: 1 mm; Provider Global: 1 mm Swollen: 0 ; Tender: 0  Joint Exam 10/17/2020   No joint exam has been documented for this visit   There is currently no information documented on the homunculus. Go to the Rheumatology activity and complete the homunculus joint exam.  Investigation: No additional findings.  Imaging: No results found.  Recent Labs: Lab Results  Component Value Date   WBC 7.7 05/16/2020   HGB 13.6 05/16/2020   PLT 294 05/16/2020   NA 137  05/16/2020   K 4.3 05/16/2020   CL 102 05/16/2020   CO2 27 05/16/2020   GLUCOSE 88 05/16/2020   BUN 13 05/16/2020   CREATININE 0.78 05/16/2020   BILITOT 0.5 05/16/2020   ALKPHOS 61 02/26/2020   AST 14 05/16/2020   ALT 12 05/16/2020   PROT 7.6 05/16/2020   ALBUMIN 4.6 02/26/2020   CALCIUM 9.6 05/16/2020   GFRAA 109 05/16/2020   QFTBGOLDPLUS NEGATIVE 08/10/2019    Speciality Comments: Comprehensive eye exam 01/16/2020 normal - performed at [facility], [place], follow up in 6 months   Procedures:  No procedures performed Allergies: Patient has no allergy information on record.   Assessment / Plan:     Visit Diagnoses: Rheumatoid arthritis with rheumatoid factor of multiple sites without organ or systems involvement (HCC) - Erosive rheumatoid arthritis: She has tenderness and synovitis over the ulnar aspect of the right wrist and over the right first MCP joint.  She has been experiencing less frequent and less severe flares since starting on Plaquenil.  She continues to  take Plaquenil 200 mg 1 tablet by mouth twice daily Monday through Friday.  She has been tolerating Plaquenil without any side effects.  She remains hesitant to add on methotrexate or any other medications as combination therapy due to the concern for immunosuppression as well as other potential side effects.  We discussed that if she develops increased joint pain, joint swelling, or morning stiffness she should notify us and reconsider more aggressive therapy.  She will follow-up in the office in 5 months.  High risk medication use - Plaquenil 200 mg 1 tablet by mouth twice daily Monday through Friday. CBC and CMP were drawn 05/16/2020.  She is due to update lab work today.  Orders for CBC and CMP were released.  Comprehensive eye exam 01/16/2020 normal - performed at [facility], follow up in 6 months.  She was advised to call to schedule a Plaquenil eye exam.- Plan: CBC with Differential/Platelet,  COMPLETE METABOLIC PANEL WITH GFR She has not had any recent infections.  Primary osteoarthritis of both hands: She has PIP and DIP thickening consistent with osteoarthritis of both hands.  She has been experiencing increased discomfort in her DIP joints which she attributes to knitting more recently.  She was able to make a complete fist on examination today.  Overall her pain and stiffness has improved significantly.  We discussed the importance of joint protection and muscle strengthening.  Primary osteoarthritis of both feet: She is not experiencing any discomfort in her feet at this time.  Warmth and synovial thickening of the left ankle joint was noted.  Psoriasis: Patches of psoriasis on the extensor surface of both elbows noted.  She uses clobetasol cream topically as needed as well as Vaseline as a moisturizer.  She is hesitant to add any additional medications at this time.  Acquired hypothyroidism  Orders: Orders Placed This Encounter  Procedures   CBC with Differential/Platelet   COMPLETE METABOLIC PANEL WITH GFR    No orders of the defined types were placed in this encounter.     Follow-Up Instructions: Return in about 5 months (around 03/19/2021) for Rheumatoid arthritis.   Ofilia Neas, PA-C  Note - This record has been created using Dragon software.  Chart creation errors have been sought, but may not always  have been located. Such creation errors do not reflect on  the standard of medical care.

## 2020-10-17 ENCOUNTER — Ambulatory Visit: Payer: 59 | Admitting: Physician Assistant

## 2020-10-17 ENCOUNTER — Other Ambulatory Visit: Payer: Self-pay

## 2020-10-17 ENCOUNTER — Encounter: Payer: Self-pay | Admitting: Physician Assistant

## 2020-10-17 VITALS — BP 130/83 | HR 84 | Ht 65.0 in | Wt 149.8 lb

## 2020-10-17 DIAGNOSIS — L409 Psoriasis, unspecified: Secondary | ICD-10-CM | POA: Diagnosis not present

## 2020-10-17 DIAGNOSIS — M19042 Primary osteoarthritis, left hand: Secondary | ICD-10-CM | POA: Diagnosis not present

## 2020-10-17 DIAGNOSIS — E039 Hypothyroidism, unspecified: Secondary | ICD-10-CM | POA: Diagnosis not present

## 2020-10-17 DIAGNOSIS — M19071 Primary osteoarthritis, right ankle and foot: Secondary | ICD-10-CM | POA: Diagnosis not present

## 2020-10-17 DIAGNOSIS — M19072 Primary osteoarthritis, left ankle and foot: Secondary | ICD-10-CM

## 2020-10-17 DIAGNOSIS — M19041 Primary osteoarthritis, right hand: Secondary | ICD-10-CM | POA: Diagnosis not present

## 2020-10-17 DIAGNOSIS — Z79899 Other long term (current) drug therapy: Secondary | ICD-10-CM

## 2020-10-17 DIAGNOSIS — M0579 Rheumatoid arthritis with rheumatoid factor of multiple sites without organ or systems involvement: Secondary | ICD-10-CM

## 2020-10-18 LAB — COMPLETE METABOLIC PANEL WITH GFR
AG Ratio: 1.6 (calc) (ref 1.0–2.5)
ALT: 10 U/L (ref 6–29)
AST: 14 U/L (ref 10–30)
Albumin: 4.4 g/dL (ref 3.6–5.1)
Alkaline phosphatase (APISO): 43 U/L (ref 31–125)
BUN: 13 mg/dL (ref 7–25)
CO2: 27 mmol/L (ref 20–32)
Calcium: 9.3 mg/dL (ref 8.6–10.2)
Chloride: 105 mmol/L (ref 98–110)
Creat: 0.81 mg/dL (ref 0.50–0.99)
Globulin: 2.8 g/dL (calc) (ref 1.9–3.7)
Glucose, Bld: 91 mg/dL (ref 65–99)
Potassium: 4.4 mmol/L (ref 3.5–5.3)
Sodium: 140 mmol/L (ref 135–146)
Total Bilirubin: 0.5 mg/dL (ref 0.2–1.2)
Total Protein: 7.2 g/dL (ref 6.1–8.1)
eGFR: 92 mL/min/{1.73_m2} (ref >=60)

## 2020-10-18 LAB — CBC WITH DIFFERENTIAL/PLATELET
Absolute Monocytes: 378 cells/uL (ref 200–950)
Basophils Absolute: 58 cells/uL (ref 0–200)
Basophils Relative: 0.9 %
Eosinophils Absolute: 19 cells/uL (ref 15–500)
Eosinophils Relative: 0.3 %
HCT: 40.9 % (ref 35.0–45.0)
Hemoglobin: 13.3 g/dL (ref 11.7–15.5)
Lymphs Abs: 1363 cells/uL (ref 850–3900)
MCH: 28.3 pg (ref 27.0–33.0)
MCHC: 32.5 g/dL (ref 32.0–36.0)
MCV: 87 fL (ref 80.0–100.0)
MPV: 9.3 fL (ref 7.5–12.5)
Monocytes Relative: 5.9 %
Neutro Abs: 4582 cells/uL (ref 1500–7800)
Neutrophils Relative %: 71.6 %
Platelets: 263 10*3/uL (ref 140–400)
RBC: 4.7 10*6/uL (ref 3.80–5.10)
RDW: 13.1 % (ref 11.0–15.0)
Total Lymphocyte: 21.3 %
WBC: 6.4 10*3/uL (ref 3.8–10.8)

## 2020-10-18 NOTE — Progress Notes (Signed)
CBC and CMP WNL

## 2020-10-30 DIAGNOSIS — L409 Psoriasis, unspecified: Secondary | ICD-10-CM | POA: Diagnosis not present

## 2020-10-31 ENCOUNTER — Other Ambulatory Visit (HOSPITAL_COMMUNITY): Payer: Self-pay

## 2020-10-31 MED ORDER — TRIAMCINOLONE ACETONIDE 0.1 % EX OINT
TOPICAL_OINTMENT | CUTANEOUS | 0 refills | Status: AC
  Filled 2020-10-31: qty 454, 30d supply, fill #0
  Filled 2020-10-31: qty 454, 28d supply, fill #0

## 2020-11-07 ENCOUNTER — Other Ambulatory Visit (HOSPITAL_COMMUNITY): Payer: Self-pay

## 2020-12-02 ENCOUNTER — Other Ambulatory Visit: Payer: Self-pay | Admitting: Rheumatology

## 2020-12-02 DIAGNOSIS — M0579 Rheumatoid arthritis with rheumatoid factor of multiple sites without organ or systems involvement: Secondary | ICD-10-CM

## 2020-12-03 ENCOUNTER — Other Ambulatory Visit (HOSPITAL_COMMUNITY): Payer: Self-pay

## 2020-12-03 MED ORDER — HYDROXYCHLOROQUINE SULFATE 200 MG PO TABS
ORAL_TABLET | ORAL | 0 refills | Status: DC
  Filled 2020-12-03: qty 120, 84d supply, fill #0

## 2020-12-03 NOTE — Telephone Encounter (Signed)
Next Visit: 03/13/2021  Last Visit: 10/17/2020  Labs: 10/17/2020 CBC and CMP WNL  Eye exam: 01/16/2020   Current Dose per office note on 10/17/2020: Plaquenil 200 mg 1 tablet by mouth twice daily Monday through Friday.  [id number] arthritis with rheumatoid factor of multiple sites without organ or systems involvement   Last Fill: 08/27/2020  Okay to refill Plaquenil?

## 2020-12-20 MED FILL — Fluticasone Propionate Nasal Susp 50 MCG/ACT: NASAL | 30 days supply | Qty: 16 | Fill #0 | Status: AC

## 2020-12-21 ENCOUNTER — Other Ambulatory Visit (HOSPITAL_COMMUNITY): Payer: Self-pay

## 2020-12-23 ENCOUNTER — Other Ambulatory Visit (HOSPITAL_COMMUNITY): Payer: Self-pay

## 2020-12-24 ENCOUNTER — Other Ambulatory Visit (HOSPITAL_COMMUNITY): Payer: Self-pay

## 2020-12-26 ENCOUNTER — Other Ambulatory Visit (HOSPITAL_COMMUNITY): Payer: Self-pay

## 2020-12-30 ENCOUNTER — Other Ambulatory Visit (HOSPITAL_COMMUNITY): Payer: Self-pay

## 2020-12-30 DIAGNOSIS — E538 Deficiency of other specified B group vitamins: Secondary | ICD-10-CM | POA: Diagnosis not present

## 2020-12-30 DIAGNOSIS — R5383 Other fatigue: Secondary | ICD-10-CM | POA: Diagnosis not present

## 2020-12-30 DIAGNOSIS — E559 Vitamin D deficiency, unspecified: Secondary | ICD-10-CM | POA: Diagnosis not present

## 2020-12-30 DIAGNOSIS — E039 Hypothyroidism, unspecified: Secondary | ICD-10-CM | POA: Diagnosis not present

## 2020-12-30 DIAGNOSIS — R6882 Decreased libido: Secondary | ICD-10-CM | POA: Diagnosis not present

## 2020-12-30 DIAGNOSIS — R790 Abnormal level of blood mineral: Secondary | ICD-10-CM | POA: Diagnosis not present

## 2020-12-30 DIAGNOSIS — A493 Mycoplasma infection, unspecified site: Secondary | ICD-10-CM | POA: Diagnosis not present

## 2020-12-30 MED ORDER — PROGESTERONE MICRONIZED 100 MG PO CAPS
ORAL_CAPSULE | ORAL | 0 refills | Status: DC
  Filled 2020-12-30: qty 90, 90d supply, fill #0

## 2020-12-30 MED ORDER — ARMOUR THYROID 90 MG PO TABS
ORAL_TABLET | ORAL | 0 refills | Status: DC
  Filled 2020-12-30: qty 90, 90d supply, fill #0

## 2020-12-30 MED ORDER — LEVOTHYROXINE SODIUM 25 MCG PO TABS
ORAL_TABLET | ORAL | 0 refills | Status: DC
  Filled 2020-12-30 – 2021-01-24 (×2): qty 90, 90d supply, fill #0

## 2021-01-03 ENCOUNTER — Other Ambulatory Visit (HOSPITAL_COMMUNITY): Payer: Self-pay

## 2021-01-03 ENCOUNTER — Encounter: Payer: Self-pay | Admitting: Rheumatology

## 2021-01-03 MED ORDER — NP THYROID 90 MG PO TABS
ORAL_TABLET | ORAL | 1 refills | Status: DC
  Filled 2021-01-03 – 2021-01-24 (×2): qty 90, 90d supply, fill #0
  Filled 2021-05-21: qty 90, 90d supply, fill #1

## 2021-01-06 DIAGNOSIS — R790 Abnormal level of blood mineral: Secondary | ICD-10-CM | POA: Diagnosis not present

## 2021-01-06 DIAGNOSIS — E559 Vitamin D deficiency, unspecified: Secondary | ICD-10-CM | POA: Diagnosis not present

## 2021-01-06 DIAGNOSIS — Z77120 Contact with and (suspected) exposure to mold (toxic): Secondary | ICD-10-CM | POA: Diagnosis not present

## 2021-01-06 DIAGNOSIS — N951 Menopausal and female climacteric states: Secondary | ICD-10-CM | POA: Diagnosis not present

## 2021-01-06 DIAGNOSIS — E538 Deficiency of other specified B group vitamins: Secondary | ICD-10-CM | POA: Diagnosis not present

## 2021-01-21 ENCOUNTER — Encounter: Payer: 59 | Admitting: Family Medicine

## 2021-01-24 ENCOUNTER — Other Ambulatory Visit (HOSPITAL_COMMUNITY): Payer: Self-pay

## 2021-01-27 ENCOUNTER — Encounter: Payer: 59 | Admitting: Medical-Surgical

## 2021-02-25 ENCOUNTER — Other Ambulatory Visit (HOSPITAL_COMMUNITY): Payer: Self-pay

## 2021-02-25 ENCOUNTER — Other Ambulatory Visit: Payer: Self-pay | Admitting: Physician Assistant

## 2021-02-25 DIAGNOSIS — M0579 Rheumatoid arthritis with rheumatoid factor of multiple sites without organ or systems involvement: Secondary | ICD-10-CM

## 2021-02-25 MED ORDER — HYDROXYCHLOROQUINE SULFATE 200 MG PO TABS
ORAL_TABLET | ORAL | 0 refills | Status: DC
  Filled 2021-02-25: qty 120, 84d supply, fill #0

## 2021-02-25 NOTE — Telephone Encounter (Signed)
Next Visit: 03/13/2021   Last Visit: 10/17/2020   Labs: 10/17/2020 CBC and CMP WNL   Eye exam: 01/20/2021 WNL   Current Dose per office note on 10/17/2020: Plaquenil 200 mg 1 tablet by mouth twice daily Monday through Friday.   [id number] arthritis with rheumatoid factor of multiple sites without organ or systems involvement    Last Fill: 12/03/2020   Okay to refill Plaquenil?

## 2021-02-27 NOTE — Progress Notes (Signed)
Office Visit Note  Patient: Mariah Palmer             Date of Birth: 1976-02-26           MRN: [id number]             PCP: Samuel Bouche, NP Referring: Samuel Bouche, NP Visit Date: 03/13/2021 Occupation: @GUAROCC @  Subjective:  Medication management.   History of Present Illness: Mariah Palmer is a 45 y.o. female with a history of erosive rheumatoid arthritis and psoriasis.  She states she has been doing well on hydroxychloroquine.  She denies any joint pain or joint swelling.  She continues to have some psoriasis lesions on her bilateral forearm, scalp behind her ear and her left eyelid.  She uses topical agents and is followed by dermatologist.  Activities of Daily Living:  Patient reports morning stiffness for 0 minutes.   Patient Denies nocturnal pain.  Difficulty dressing/grooming: Denies Difficulty climbing stairs: Denies Difficulty getting out of chair: Denies Difficulty using hands for taps, buttons, cutlery, and/or writing: Denies  Review of Systems  Constitutional:  Negative for fatigue.  HENT:  Positive for mouth dryness. Negative for mouth sores and nose dryness.   Eyes:  Negative for pain, itching and dryness.  Respiratory:  Negative for shortness of breath and difficulty breathing.   Cardiovascular:  Negative for chest pain and palpitations.  Gastrointestinal:  Negative for blood in stool, constipation and diarrhea.  Endocrine: Negative for increased urination.  Genitourinary:  Negative for difficulty urinating.  Musculoskeletal:  Negative for joint pain, joint pain, joint swelling, myalgias, morning stiffness, muscle tenderness and myalgias.  Skin:  Positive for rash. Negative for color change.  Allergic/Immunologic: Negative for susceptible to infections.  Neurological:  Negative for dizziness, numbness, headaches, memory loss and weakness.  Hematological:  Negative for bruising/bleeding tendency.  Psychiatric/Behavioral:  Negative for confusion.    PMFS  History:  Patient Active Problem List   Diagnosis Date Noted   Irregular menses 05/03/2020   Snoring 05/03/2020   Rheumatoid arthritis involving multiple sites with positive rheumatoid factor ([place]) 09/13/2017   High risk medication use 09/13/2017   Primary osteoarthritis of both hands 09/13/2017   Primary osteoarthritis of both feet 09/13/2017   Rheumatoid factor positive 08/24/2017   Psoriasis 10/24/2016   Seasonal allergic rhinitis due to pollen 12/09/2015   Acquired hypothyroidism 02/03/2012   Alopecia areata 02/03/2012    Past Medical History:  Diagnosis Date   Psoriasis    Rheumatoid arthritis ([place])    Thyroid disease     Family History  Problem Relation Age of Onset   Prostate cancer Father    Heart disease Father    Breast cancer Paternal Aunt    Past Surgical History:  Procedure Laterality Date   DILATION AND CURETTAGE OF UTERUS     TONSILLECTOMY AND ADENOIDECTOMY     Social History   Social History Narrative   Not on file   Immunization History  Administered Date(s) Administered   Influenza-Unspecified 02/03/2012, 01/12/2019, 01/12/2020   PFIZER(Purple Top)SARS-COV-2 Vaccination 04/24/2019, 05/16/2019   Tdap 02/03/2007, 05/03/2020     Objective: Vital Signs: BP 124/81 (BP Location: Left Arm, Patient Position: Sitting, Cuff Size: Normal)   Pulse 81   Ht 5\' 5"  (1.651 m)   Wt 148 lb 12.8 oz (67.5 kg)   BMI 24.76 kg/m    Physical Exam Vitals and nursing note reviewed.  Constitutional:      Appearance: She is well-developed.  HENT:  Head: Normocephalic and atraumatic.  Eyes:     Conjunctiva/sclera: Conjunctivae normal.  Cardiovascular:     Rate and Rhythm: Normal rate and regular rhythm.     Heart sounds: Normal heart sounds.  Pulmonary:     Effort: Pulmonary effort is normal.     Breath sounds: Normal breath sounds.  Abdominal:     General: Bowel sounds are normal.     Palpations: Abdomen is soft.  Musculoskeletal:     Cervical back:  Normal range of motion.  Lymphadenopathy:     Cervical: No cervical adenopathy.  Skin:    General: Skin is warm and dry.     Capillary Refill: Capillary refill takes less than 2 seconds.  Neurological:     Mental Status: She is alert and oriented to person, place, and time.  Psychiatric:        Behavior: Behavior normal.     Musculoskeletal Exam: C-spine was in good range of motion.  Shoulder joints, elbow joints, wrist joints, MCPs PIPs and DIPs with good range of motion with no synovitis.  Hip joints, knee joints, ankles, MTPs and PIPs with good range of motion with no synovitis.  CDAI Exam: CDAI Score: 0  Patient Global: 0 mm; Provider Global: 0 mm Swollen: 0 ; Tender: 0  Joint Exam 03/13/2021   No joint exam has been documented for this visit   There is currently no information documented on the homunculus. Go to the Rheumatology activity and complete the homunculus joint exam.  Investigation: No additional findings.  Imaging: XR Foot 2 Views Left  Result Date: 03/13/2021 First MTP narrowing and subluxation was noted.  PIP and DIP narrowing was noted.  Cystic versus erosive change was noted over the distal medial aspect of the first proximal phalanx.  No intertarsal or tibiotalar joint space narrowing was noted. Impression: These findings are consistent with rheumatoid arthritis and osteoarthritis overlap.  XR Foot 2 Views Right  Result Date: 03/13/2021 First MTP narrowing with cystic versus erosive change was noted in the first metatarsal.  None of the other MTPs showed any narrowing or erosive changes.  PIP and DIP narrowing was noted.  No intertarsal, tibiotalar or subtalar joint space narrowing was noted.  Impression: These findings are consistent with rheumatoid arthritis and osteoarthritis overlap.  XR Hand 2 View Left  Result Date: 03/13/2021 Juxta-articular osteopenia was noted.  Erosion was noted over the first distal first metacarpal.  PIP and DIP narrowing was  noted.  No intercarpal or radiocarpal narrowing was noted.  No radiographic progression was noted when compared to the x-rays of 2021. Impression: These findings are consistent with erosive rheumatoid arthritis and osteoarthritis overlap.  XR Hand 2 View Right  Result Date: 03/13/2021 Juxta-articular osteopenia was noted.  PIP and DIP narrowing was noted.  Possible early erosion was noted over distal first metacarpal.  Erosive change was noted over the proximal second proximal phalanx.  No intercarpal or radiocarpal joint space narrowing was noted. Impression: These findings are consistent with erosive rheumatoid arthritis and osteoarthritis overlap.   Recent Labs: Lab Results  Component Value Date   WBC 6.4 10/17/2020   HGB 13.3 10/17/2020   PLT 263 10/17/2020   NA 140 10/17/2020   K 4.4 10/17/2020   CL 105 10/17/2020   CO2 27 10/17/2020   GLUCOSE 91 10/17/2020   BUN 13 10/17/2020   CREATININE 0.81 10/17/2020   BILITOT 0.5 10/17/2020   ALKPHOS 61 02/26/2020   AST 14 10/17/2020   ALT 10  10/17/2020   PROT 7.2 10/17/2020   ALBUMIN 4.6 02/26/2020   CALCIUM 9.3 10/17/2020   GFRAA 109 05/16/2020   QFTBGOLDPLUS NEGATIVE 08/10/2019    Speciality Comments: Comprehensive eye exam 01/20/2021 WNL - performed at [facility], follow up in 1 year   Procedures:  No procedures performed Allergies: Patient has no allergy information on record.   Assessment / Plan:     Visit Diagnoses: Rheumatoid arthritis with rheumatoid factor of multiple sites without organ or systems involvement (HCC) - Erosive rheumatoid arthritis: -She denies any joint pain or joint swelling.  She has been taking hydroxychloroquine 200 mg p.o. twice daily Monday to Friday on a regular basis.  She had no synovitis on examination.  She still have aggressive psoriasis.  Worsening of psoriasis on hydroxychloroquine was discussed.  She has not noticed any worsening of the psoriasis on hydroxychloroquine.  I also  discussed possibly adding methotrexate which will be more effective treatment for erosive rheumatoid arthritis and also will benefit psoriasis.  She does not want to switch the medication at this point.  I will repeat x-rays today to monitor for any disease progression.  I will also obtain sedimentation rate.  Plan: XR Hand 2 View Right, XR Hand 2 View Left, XR Foot 2 Views Right, XR Foot 2 Views Left.  X-rays of bilateral foot compared and some progression in the erosive changes were noted.  X-rays of bilateral feet also showed some progression and erosive changes.  I reviewed the x-rays with the patient.  She does not want to switch to methotrexate at this point.  High risk medication use - Plaquenil 200 mg 1 tablet by mouth twice daily Monday through Friday. Comprehensive eye exam 01/20/2021 WNL.  Left findings were reviewed with the patient.- Plan: CBC with Differential/Platelet, COMPLETE METABOLIC PANEL WITH GFR today and then every today and then every 5 months to monitor for drug toxicity.  She is getting annual eye examination.  Information about immunization was also placed in the AVS.  Primary osteoarthritis of both hands-joint protection muscle strengthening was discussed.  She has mild DIP and PIP thickening.  She denies any discomfort.  Primary osteoarthritis of both feet-there was no tenderness over ankles or MTPs.  Psoriasis-she has psoriasis patches behind her years, over the extensor surface of her bilateral forearms, elbows and over her left eyelid.  She has been using topical agents as recommended by her dermatologist.  Hyperpigmentation-she noticed a patch of hyperpigmentation on her right forearm.  I am uncertain if it is related to hydroxychloroquine or not.  We discussed switching to methotrexate if she gets concerned about it.  Acquired hypothyroidism-she is on levothyroxine.  Orders: Orders Placed This Encounter  Procedures   XR Hand 2 View Right   XR Hand 2 View Left   XR  Foot 2 Views Right   XR Foot 2 Views Left   CBC with Differential/Platelet   COMPLETE METABOLIC PANEL WITH GFR   Sedimentation rate    No orders of the defined types were placed in this encounter.    Follow-Up Instructions: Return in about 5 months (around 08/11/2021) for Rheumatoid arthritis,Ps.   Bo Merino, MD  Note - This record has been created using Editor, commissioning.  Chart creation errors have been sought, but may not always  have been located. Such creation errors do not reflect on  the standard of medical care.

## 2021-03-13 ENCOUNTER — Ambulatory Visit: Payer: Self-pay

## 2021-03-13 ENCOUNTER — Ambulatory Visit: Payer: 59 | Admitting: Rheumatology

## 2021-03-13 ENCOUNTER — Other Ambulatory Visit: Payer: Self-pay

## 2021-03-13 ENCOUNTER — Encounter: Payer: Self-pay | Admitting: Rheumatology

## 2021-03-13 VITALS — BP 124/81 | HR 81 | Ht 65.0 in | Wt 148.8 lb

## 2021-03-13 DIAGNOSIS — E039 Hypothyroidism, unspecified: Secondary | ICD-10-CM

## 2021-03-13 DIAGNOSIS — L819 Disorder of pigmentation, unspecified: Secondary | ICD-10-CM

## 2021-03-13 DIAGNOSIS — L409 Psoriasis, unspecified: Secondary | ICD-10-CM | POA: Diagnosis not present

## 2021-03-13 DIAGNOSIS — M19041 Primary osteoarthritis, right hand: Secondary | ICD-10-CM | POA: Diagnosis not present

## 2021-03-13 DIAGNOSIS — Z79899 Other long term (current) drug therapy: Secondary | ICD-10-CM

## 2021-03-13 DIAGNOSIS — M19072 Primary osteoarthritis, left ankle and foot: Secondary | ICD-10-CM

## 2021-03-13 DIAGNOSIS — M19071 Primary osteoarthritis, right ankle and foot: Secondary | ICD-10-CM | POA: Diagnosis not present

## 2021-03-13 DIAGNOSIS — M0579 Rheumatoid arthritis with rheumatoid factor of multiple sites without organ or systems involvement: Secondary | ICD-10-CM

## 2021-03-13 DIAGNOSIS — M19042 Primary osteoarthritis, left hand: Secondary | ICD-10-CM | POA: Diagnosis not present

## 2021-03-13 NOTE — Patient Instructions (Signed)
Vaccines You are taking a medication(s) that can suppress your immune system.  The following immunizations are recommended: Flu annually Covid-19  Td/Tdap (tetanus, diphtheria, pertussis) every 10 years Pneumonia (Prevnar 15 then Pneumovax 23 at least 1 year apart.  Alternatively, can take Prevnar 20 without needing additional dose) Shingrix: 2 doses from 4 weeks to 6 months apart  Please check with your PCP to make sure you are up to date.  

## 2021-03-14 LAB — COMPLETE METABOLIC PANEL WITH GFR
AG Ratio: 1.5 (calc) (ref 1.0–2.5)
ALT: 10 U/L (ref 6–29)
AST: 14 U/L (ref 10–30)
Albumin: 4.6 g/dL (ref 3.6–5.1)
Alkaline phosphatase (APISO): 48 U/L (ref 31–125)
BUN: 10 mg/dL (ref 7–25)
CO2: 30 mmol/L (ref 20–32)
Calcium: 9.9 mg/dL (ref 8.6–10.2)
Chloride: 101 mmol/L (ref 98–110)
Creat: 0.72 mg/dL (ref 0.50–0.99)
Globulin: 3 g/dL (calc) (ref 1.9–3.7)
Glucose, Bld: 88 mg/dL (ref 65–99)
Potassium: 4.8 mmol/L (ref 3.5–5.3)
Sodium: 138 mmol/L (ref 135–146)
Total Bilirubin: 0.6 mg/dL (ref 0.2–1.2)
Total Protein: 7.6 g/dL (ref 6.1–8.1)
eGFR: 106 mL/min/{1.73_m2} (ref >=60)

## 2021-03-14 LAB — CBC WITH DIFFERENTIAL/PLATELET
Absolute Monocytes: 422 cells/uL (ref 200–950)
Basophils Absolute: 92 cells/uL (ref 0–200)
Basophils Relative: 1.4 %
Eosinophils Absolute: 119 cells/uL (ref 15–500)
Eosinophils Relative: 1.8 %
HCT: 42.9 % (ref 35.0–45.0)
Hemoglobin: 13.9 g/dL (ref 11.7–15.5)
Lymphs Abs: 1525 cells/uL (ref 850–3900)
MCH: 27.8 pg (ref 27.0–33.0)
MCHC: 32.4 g/dL (ref 32.0–36.0)
MCV: 85.8 fL (ref 80.0–100.0)
MPV: 9.5 fL (ref 7.5–12.5)
Monocytes Relative: 6.4 %
Neutro Abs: 4442 cells/uL (ref 1500–7800)
Neutrophils Relative %: 67.3 %
Platelets: 313 10*3/uL (ref 140–400)
RBC: 5 10*6/uL (ref 3.80–5.10)
RDW: 13.1 % (ref 11.0–15.0)
Total Lymphocyte: 23.1 %
WBC: 6.6 10*3/uL (ref 3.8–10.8)

## 2021-03-14 LAB — SEDIMENTATION RATE: Sed Rate: 9 mm/h (ref 0–20)

## 2021-03-14 NOTE — Progress Notes (Signed)
CBC, CMP and sed rate are normal.

## 2021-04-17 ENCOUNTER — Ambulatory Visit: Payer: 59 | Admitting: Rheumatology

## 2021-04-26 ENCOUNTER — Other Ambulatory Visit (HOSPITAL_COMMUNITY): Payer: Self-pay

## 2021-04-27 MED ORDER — LEVOTHYROXINE SODIUM 25 MCG PO TABS
ORAL_TABLET | ORAL | 0 refills | Status: DC
  Filled 2021-04-27: qty 90, 90d supply, fill #0

## 2021-04-27 MED ORDER — PROGESTERONE MICRONIZED 100 MG PO CAPS
ORAL_CAPSULE | ORAL | 0 refills | Status: DC
  Filled 2021-04-27: qty 90, 90d supply, fill #0

## 2021-04-28 ENCOUNTER — Other Ambulatory Visit (HOSPITAL_COMMUNITY): Payer: Self-pay

## 2021-05-21 ENCOUNTER — Other Ambulatory Visit: Payer: Self-pay | Admitting: Physician Assistant

## 2021-05-21 DIAGNOSIS — M0579 Rheumatoid arthritis with rheumatoid factor of multiple sites without organ or systems involvement: Secondary | ICD-10-CM

## 2021-05-22 ENCOUNTER — Other Ambulatory Visit (HOSPITAL_COMMUNITY): Payer: Self-pay

## 2021-05-22 MED ORDER — HYDROXYCHLOROQUINE SULFATE 200 MG PO TABS
ORAL_TABLET | ORAL | 0 refills | Status: DC
  Filled 2021-05-22: qty 120, 80d supply, fill #0

## 2021-05-22 NOTE — Telephone Encounter (Signed)
Next Visit: 08/14/2021  Last Visit: 03/13/2021  Labs: 03/13/2021 CBC, CMP and sed rate are normal.  Eye exam: Comprehensive eye exam 01/20/2021 WNL    Current Dose per office note 03/13/2021: Plaquenil 200 mg 1 tablet by mouth twice daily Monday through [place]  DX: Rheumatoid arthritis with rheumatoid factor of multiple sites without organ or systems involvement   Last Fill: 02/25/2021  Okay to refill Plaquenil?

## 2021-07-25 ENCOUNTER — Other Ambulatory Visit (HOSPITAL_COMMUNITY)
Admission: RE | Admit: 2021-07-25 | Discharge: 2021-07-25 | Disposition: A | Discharge status: Home / Self Care | Payer: 59 | Source: Ambulatory Visit | Attending: Medical-Surgical | Admitting: Medical-Surgical

## 2021-07-25 ENCOUNTER — Encounter: Payer: Self-pay | Admitting: Medical-Surgical

## 2021-07-25 ENCOUNTER — Ambulatory Visit (INDEPENDENT_AMBULATORY_CARE_PROVIDER_SITE_OTHER): Payer: 59 | Admitting: Medical-Surgical

## 2021-07-25 VITALS — BP 114/74 | HR 78 | Resp 20 | Ht 65.0 in | Wt 152.2 lb

## 2021-07-25 DIAGNOSIS — Z124 Encounter for screening for malignant neoplasm of cervix: Secondary | ICD-10-CM | POA: Diagnosis not present

## 2021-07-25 DIAGNOSIS — Z1231 Encounter for screening mammogram for malignant neoplasm of breast: Secondary | ICD-10-CM

## 2021-07-25 DIAGNOSIS — Z0000 Encounter for general adult medical examination without abnormal findings: Secondary | ICD-10-CM | POA: Insufficient documentation

## 2021-07-25 DIAGNOSIS — E039 Hypothyroidism, unspecified: Secondary | ICD-10-CM | POA: Diagnosis not present

## 2021-07-25 NOTE — Progress Notes (Signed)
HPI: ?Mariah Palmer is a 46 y.o. female who  has a past medical history of Psoriasis, Rheumatoid arthritis ([place]), and Thyroid disease. ? ?she presents to [facility] today, 07/25/21,  ?for chief complaint of: ?Annual physical exam ? ?Dentist: UTD ?Eye exam: UTD, yearly ?Exercise: none intentional ?Diet: no restrictions, limits bread  ?Pap smear: today ?Mammogram: ordered today ?COVID vaccine: done ? ?Concerns: ?None ? ?Past medical, surgical, social and family history reviewed: ? ?Patient Active Problem List  ? Diagnosis Date Noted  ? Irregular menses 05/03/2020  ? Snoring 05/03/2020  ? Rheumatoid arthritis involving multiple sites with positive rheumatoid factor ([place]) 09/13/2017  ? High risk medication use 09/13/2017  ? Primary osteoarthritis of both hands 09/13/2017  ? Primary osteoarthritis of both feet 09/13/2017  ? Rheumatoid factor positive 08/24/2017  ? Psoriasis 10/24/2016  ? Seasonal allergic rhinitis due to pollen 12/09/2015  ? Acquired hypothyroidism 02/03/2012  ? Alopecia areata 02/03/2012  ? ? ?Past Surgical History:  ?Procedure Laterality Date  ? DILATION AND CURETTAGE OF UTERUS    ? TONSILLECTOMY AND ADENOIDECTOMY    ? ? ?Social History  ? ?Tobacco Use  ? Smoking status: Never  ? Smokeless tobacco: Never  ?Substance Use Topics  ? Alcohol use: Yes  ?  Comment: occasionally  ? ? ?Family History  ?Problem Relation Age of Onset  ? Prostate cancer Father   ? Heart disease Father   ? Breast cancer Paternal Aunt   ?  ? ?Current medication list and allergy/intolerance information reviewed:   ? ?Current Outpatient Medications  ?Medication Sig Dispense Refill  ? fluticasone (FLONASE) 50 MCG/ACT nasal spray Place 2 sprays into both nostrils daily as needed 16 g 3  ? hydroxychloroquine (PLAQUENIL) 200 MG tablet TAKE 1 TABLET BY MOUTH 2 TIMES DAILY MONDAY THROUGH FRIDAY 120 tablet 0  ? levothyroxine (LEVOXYL) 25 MCG tablet TAKE 1 TABLET EVERY MORNING ON AN EMPTY STOMACH, MAY  EAT IN 30 TO 60 MINUTES 90 tablet 0  ? Multiple Vitamins-Calcium (ONE-A-DAY WOMENS PO) Take by mouth.    ? Probiotic Product (PROBIOTIC PO) Take by mouth daily.    ? progesterone (PROMETRIUM) 100 MG capsule TAKE 1 CAPSULE BY MOUTH NIGHTLY ON DAYS 5-28 OF CYCLE 90 capsule 0  ? thyroid (NP THYROID) 90 MG tablet Take 1 tablet by mouth in the morning 20-30 mins prior to breakfast. 90 tablet 1  ? triamcinolone ointment (KENALOG) 0.1 % Apply to areas of rash twice daily for 2 - 4 weeks-- avoid use on face and skin folds 454 g 0  ? ?No current facility-administered medications for this visit.  ? ? ?No Known Allergies ?  ? ?Review of Systems: ?Constitutional:  No  fever, no chills, No recent illness, No unintentional weight changes. No significant fatigue.  ?HEENT: No  headache, no vision change, no hearing change, No sore throat, No  sinus pressure ?Cardiac: No  chest pain, No  pressure, No palpitations, No  Orthopnea ?Respiratory:  No  shortness of breath. No  Cough ?Gastrointestinal: No  abdominal pain, No  nausea, No  vomiting,  No  blood in stool, No  diarrhea, No  constipation  ?Musculoskeletal: No new myalgia/arthralgia ?Skin: No  Rash, No other wounds/concerning lesions ?Genitourinary: No  incontinence, No  abnormal genital bleeding, No abnormal genital discharge ?Hem/Onc: No  easy bruising/bleeding, No  abnormal lymph node ?Endocrine: No cold intolerance,  No heat intolerance. No polyuria/polydipsia/polyphagia  ?Neurologic: No  weakness, No  dizziness, No  slurred speech/focal weakness/facial droop ?Psychiatric: No  concerns with depression, No  concerns with anxiety, No sleep problems, No mood problems ? ?Exam:  ?BP 114/74 (BP Location: Right Arm, Cuff Size: Normal)   Pulse 78   Resp 20   Ht '5\' 5"'$  (1.651 m)   Wt 152 lb 3.2 oz (69 kg)   SpO2 98%   BMI 25.33 kg/m?  ?Constitutional: VS see above. General Appearance: alert, well-developed, well-nourished, NAD ?Eyes: Normal lids and conjunctive, non-icteric  sclera ?Ears, Nose, Mouth, Throat: MMM, Normal external inspection ears/nares/mouth/lips/gums. TM normal bilaterally. Pharynx/tonsils no erythema, no exudate. Nasal mucosa normal.  ?Neck: No masses, trachea midline. No thyroid enlargement. No tenderness/mass appreciated. No lymphadenopathy ?Respiratory: Normal respiratory effort. no wheeze, no rhonchi, no rales ?Cardiovascular: S1/S2 normal, no murmur, no rub/gallop auscultated. RRR. No lower extremity edema. Pedal pulse II/IV bilaterally DP and PT. No carotid bruit or JVD. No abdominal aortic bruit. ?Gastrointestinal: Nontender, no masses. No hepatomegaly, no splenomegaly. No hernia appreciated. Bowel sounds normal. Rectal exam deferred.  ?Musculoskeletal: Gait normal. No clubbing/cyanosis of digits.  ?Neurological: Normal balance/coordination. No tremor. No cranial nerve deficit on limited exam. Motor and sensation intact and symmetric. Cerebellar reflexes intact.  ?Skin: warm, dry, intact. No rash/ulcer. No concerning nevi or subq nodules on limited exam.   ?Psychiatric: Normal judgment/insight. Normal mood and affect. Oriented x3.  ? ? ?ASSESSMENT/PLAN:  ? ?1. Annual physical exam checking TSH ?Checking labs as below.  Up-to-date on preventative care.  Wellness information provided with AVS. ?- CBC with Differential/Platelet ?- COMPLETE METABOLIC PANEL WITH GFR ?- Lipid panel ?- TSH ?- Cytology - PAP ? ?2. Cervical cancer screening ?Pap smear with HPV cotesting today. ? ?3. Acquired hypothyroidism ?Checking TSH. ?- TSH ? ?4. Encounter for screening mammogram for malignant neoplasm of breast ?Mammogram ordered today. ?- MM 3D SCREEN BREAST BILATERAL; Future ? ? ?Orders Placed This Encounter  ?Procedures  ? MM 3D SCREEN BREAST BILATERAL  ? CBC with Differential/Platelet  ? COMPLETE METABOLIC PANEL WITH GFR  ? Lipid panel  ? TSH  ? ? ?No orders of the defined types were placed in this encounter. ? ? ?There are no Patient Instructions on file for this  visit. ? ?Follow-up plan: Return in about 1 year (around 07/26/2022) for annual physical exam or sooner if needed. ? ?Clearnce Sorrel, DNP, APRN, FNP-BC ?[place] ?Primary Care and Sports Medicine ?

## 2021-07-26 LAB — COMPLETE METABOLIC PANEL WITH GFR
AG Ratio: 1.4 (calc) (ref 1.0–2.5)
ALT: 9 U/L (ref 6–29)
AST: 11 U/L (ref 10–30)
Albumin: 4.5 g/dL (ref 3.6–5.1)
Alkaline phosphatase (APISO): 48 U/L (ref 31–125)
BUN: 12 mg/dL (ref 7–25)
CO2: 30 mmol/L (ref 20–32)
Calcium: 9.7 mg/dL (ref 8.6–10.2)
Chloride: 103 mmol/L (ref 98–110)
Creat: 0.67 mg/dL (ref 0.50–0.99)
Globulin: 3.2 g/dL (calc) (ref 1.9–3.7)
Glucose, Bld: 84 mg/dL (ref 65–99)
Potassium: 4.1 mmol/L (ref 3.5–5.3)
Sodium: 140 mmol/L (ref 135–146)
Total Bilirubin: 0.3 mg/dL (ref 0.2–1.2)
Total Protein: 7.7 g/dL (ref 6.1–8.1)
eGFR: 110 mL/min/{1.73_m2} (ref >=60)

## 2021-07-26 LAB — CBC WITH DIFFERENTIAL/PLATELET
Absolute Monocytes: 468 cells/uL (ref 200–950)
Basophils Absolute: 79 cells/uL (ref 0–200)
Basophils Relative: 1.1 %
Eosinophils Absolute: 7 cells/uL — ABNORMAL LOW (ref 15–500)
Eosinophils Relative: 0.1 %
HCT: 39.9 % (ref 35.0–45.0)
Hemoglobin: 13.4 g/dL (ref 11.7–15.5)
Lymphs Abs: 1584 cells/uL (ref 850–3900)
MCH: 28.3 pg (ref 27.0–33.0)
MCHC: 33.6 g/dL (ref 32.0–36.0)
MCV: 84.2 fL (ref 80.0–100.0)
MPV: 9.4 fL (ref 7.5–12.5)
Monocytes Relative: 6.5 %
Neutro Abs: 5062 cells/uL (ref 1500–7800)
Neutrophils Relative %: 70.3 %
Platelets: 278 10*3/uL (ref 140–400)
RBC: 4.74 10*6/uL (ref 3.80–5.10)
RDW: 12.7 % (ref 11.0–15.0)
Total Lymphocyte: 22 %
WBC: 7.2 10*3/uL (ref 3.8–10.8)

## 2021-07-26 LAB — LIPID PANEL
Cholesterol: 146 mg/dL (ref <200)
HDL: 53 mg/dL (ref >=50)
LDL Cholesterol (Calc): 77 mg/dL (calc)
Non-HDL Cholesterol (Calc): 93 mg/dL (calc) (ref <130)
Total CHOL/HDL Ratio: 2.8 (calc) (ref <5.0)
Triglycerides: 82 mg/dL (ref <150)

## 2021-07-26 LAB — TSH: TSH: 0.23 mIU/L — ABNORMAL LOW

## 2021-07-28 ENCOUNTER — Encounter: Payer: Self-pay | Admitting: Medical-Surgical

## 2021-07-30 DIAGNOSIS — M0579 Rheumatoid arthritis with rheumatoid factor of multiple sites without organ or systems involvement: Secondary | ICD-10-CM | POA: Diagnosis not present

## 2021-07-30 DIAGNOSIS — Z13220 Encounter for screening for lipoid disorders: Secondary | ICD-10-CM | POA: Diagnosis not present

## 2021-07-30 DIAGNOSIS — E538 Deficiency of other specified B group vitamins: Secondary | ICD-10-CM | POA: Diagnosis not present

## 2021-07-30 DIAGNOSIS — E039 Hypothyroidism, unspecified: Secondary | ICD-10-CM | POA: Diagnosis not present

## 2021-07-30 DIAGNOSIS — R790 Abnormal level of blood mineral: Secondary | ICD-10-CM | POA: Diagnosis not present

## 2021-07-30 DIAGNOSIS — R6882 Decreased libido: Secondary | ICD-10-CM | POA: Diagnosis not present

## 2021-07-30 DIAGNOSIS — N951 Menopausal and female climacteric states: Secondary | ICD-10-CM | POA: Diagnosis not present

## 2021-07-30 DIAGNOSIS — E559 Vitamin D deficiency, unspecified: Secondary | ICD-10-CM | POA: Diagnosis not present

## 2021-07-30 DIAGNOSIS — R5383 Other fatigue: Secondary | ICD-10-CM | POA: Diagnosis not present

## 2021-07-30 LAB — CYTOLOGY - PAP
Comment: NEGATIVE
Diagnosis: NEGATIVE
High risk HPV: NEGATIVE

## 2021-07-31 NOTE — Progress Notes (Signed)
Office Visit Note  Patient: Mariah Palmer             Date of Birth: [birth year]           MRN: [id number]             PCP: Samuel Bouche, NP Referring: Samuel Bouche, NP Visit Date: 08/14/2021 Occupation: '@GUAROCC'$ @  Subjective:  Medication monitoring  History of Present Illness: Mariah Palmer is a 46 y.o. female with history of seropositive rheumatoid arthritis and osteoarthritis.  She is taking Plaquenil 200 mg 1 tablet by mouth twice daily Monday through Friday.  She is tolerating plaquenil without any side effects.  Patient reports that she has occasional "twinges" of pain in both thumbs.  She denies any increased joint pain or joint swelling since her last office visit.  She states usually the periodic pain is related to weather changes.  She has not needed to take any over-the-counter products for symptomatic relief. she denies any eye pain, photophobia, or eye redness.  She has not had any new or worsening pulmonary symptoms.  She denies any new medical conditions since her last office visit.  She denies any new questions or concerns.   Activities of Daily Living:  Patient reports morning stiffness for 0 minutes.   Patient Denies nocturnal pain.  Difficulty dressing/grooming: Denies Difficulty climbing stairs: Denies Difficulty getting out of chair: Denies Difficulty using hands for taps, buttons, cutlery, and/or writing: Denies  Review of Systems  Constitutional:  Positive for fatigue.  HENT:  Negative for mouth sores, mouth dryness and nose dryness.   Eyes:  Negative for pain, itching and dryness.  Respiratory:  Negative for shortness of breath and difficulty breathing.   Cardiovascular:  Negative for chest pain and palpitations.  Gastrointestinal:  Negative for blood in stool, constipation and diarrhea.  Endocrine: Negative for increased urination.  Genitourinary:  Negative for difficulty urinating.  Musculoskeletal:  Negative for joint pain, joint pain, joint swelling,  myalgias, morning stiffness, muscle tenderness and myalgias.  Skin:  Negative for color change, rash and redness.  Allergic/Immunologic: Negative for susceptible to infections.  Neurological:  Negative for dizziness, numbness, headaches, memory loss and weakness.  Hematological:  Negative for bruising/bleeding tendency.  Psychiatric/Behavioral:  Negative for confusion.    PMFS History:  Patient Active Problem List   Diagnosis Date Noted   Irregular menses 05/03/2020   Snoring 05/03/2020   Rheumatoid arthritis involving multiple sites with positive rheumatoid factor ([place]) 09/13/2017   High risk medication use 09/13/2017   Primary osteoarthritis of both hands 09/13/2017   Primary osteoarthritis of both feet 09/13/2017   Rheumatoid factor positive 08/24/2017   Psoriasis 10/24/2016   Seasonal allergic rhinitis due to pollen 12/09/2015   Acquired hypothyroidism 02/03/2012   Alopecia areata 02/03/2012    Past Medical History:  Diagnosis Date   Psoriasis    Rheumatoid arthritis ([place])    Thyroid disease     Family History  Problem Relation Age of Onset   Prostate cancer Father    Heart disease Father    Breast cancer Paternal Aunt    Past Surgical History:  Procedure Laterality Date   DILATION AND CURETTAGE OF UTERUS     TONSILLECTOMY AND ADENOIDECTOMY     Social History   Social History Narrative   Not on file   Immunization History  Administered Date(s) Administered   Influenza-Unspecified 02/03/2012, 01/12/2019, 01/12/2020, 01/09/2021   PFIZER(Purple Top)SARS-COV-2 Vaccination 04/24/2019, 05/16/2019   Tdap 02/03/2007, 05/03/2020  Objective: Vital Signs: BP 105/73 (BP Location: Left Arm, Patient Position: Sitting, Cuff Size: Normal)   Pulse 85   Ht '5\' 5"'$  (1.651 m)   Wt 153 lb 3.2 oz (69.5 kg)   BMI 25.49 kg/m    Physical Exam Vitals and nursing note reviewed.  Constitutional:      Appearance: She is well-developed.  HENT:     Head: Normocephalic and  atraumatic.  Eyes:     Conjunctiva/sclera: Conjunctivae normal.  Cardiovascular:     Rate and Rhythm: Normal rate and regular rhythm.     Heart sounds: Normal heart sounds.  Pulmonary:     Effort: Pulmonary effort is normal.     Breath sounds: Normal breath sounds.  Abdominal:     General: Bowel sounds are normal.     Palpations: Abdomen is soft.  Musculoskeletal:     Cervical back: Normal range of motion.  Skin:    General: Skin is warm and dry.     Capillary Refill: Capillary refill takes less than 2 seconds.     Comments: Xerosis on elbows  Neurological:     Mental Status: She is alert and oriented to person, place, and time.  Psychiatric:        Behavior: Behavior normal.     Musculoskeletal Exam: C-spine, thoracic spine, lumbar spine have good range of motion.  No midline spinal tenderness or SI joint tenderness.  Shoulder joints and elbow joints have good range of motion with no discomfort or tenderness.  Tenderness and synovitis over the ulnar aspect of the right wrist.  Tenderness and synovitis of bilateral first MCP joints.  Complete fist formation bilaterally.  Hip joints have good range of motion with no groin pain.  Knee joints have good range of motion with no warmth or effusion.  Ankle joints have good range of motion with no joint tenderness.  CDAI Exam: CDAI Score: 6.4  Patient Global: 0 mm; Provider Global: 4 mm Swollen: 3 ; Tender: 3  Joint Exam 08/14/2021      Right  Left  Wrist  Swollen Tender     MCP 1  Swollen Tender  Swollen Tender     Investigation: No additional findings.  Imaging: No results found.  Recent Labs: Lab Results  Component Value Date   WBC 7.2 07/25/2021   HGB 13.4 07/25/2021   PLT 278 07/25/2021   NA 140 07/25/2021   K 4.1 07/25/2021   CL 103 07/25/2021   CO2 30 07/25/2021   GLUCOSE 84 07/25/2021   BUN 12 07/25/2021   CREATININE 0.67 07/25/2021   BILITOT 0.3 07/25/2021   ALKPHOS 61 02/26/2020   AST 11 07/25/2021   ALT  9 07/25/2021   PROT 7.7 07/25/2021   ALBUMIN 4.6 02/26/2020   CALCIUM 9.7 07/25/2021   GFRAA 109 05/16/2020   QFTBGOLDPLUS NEGATIVE 08/10/2019    Speciality Comments: Comprehensive eye exam 01/20/2021 WNL - performed at [facility], [place], follow up in 1 year   Procedures:  No procedures performed Allergies: Patient has no known allergies.    Assessment / Plan:     Visit Diagnoses: Rheumatoid arthritis with rheumatoid factor of multiple sites without organ or systems involvement (HCC) - Erosive rheumatoid arthritis: She presents today with ongoing joint tenderness and synovitis in the ulnar aspect of the wrist wrist and bilateral first MCP joints.  She remains on Plaquenil 200 mg 1 tablet by mouth twice daily Monday through Friday.  She continues to tolerate Plaquenil without any side  effects.  She experiences periodic " twinges of pain" typically exacerbated by weather changes.  She has not been taking any over-the-counter products for symptomatic relief.  X-rays of both hands and feet were obtained at her last office visit on 03/13/2021 which were consistent with erosive rheumatoid arthritis and osteoarthritis overlap.  Previously discussed adding methotrexate as combination therapy but she declined.  She does not want to make any medication changes at this time.  She will remain on Plaquenil as prescribed.  She was advised to notify us if she develops any new or worsening symptoms.  She will follow-up in the office in 5 months or sooner if needed.- Plan: hydroxychloroquine (PLAQUENIL) 200 MG tablet  High risk medication use - Plaquenil 200 mg 1 tablet by mouth twice daily Monday through Friday. CBC and CMP updated on 07/25/2021.  Her next lab work will be due at the end of September/early October 2023.  Comprehensive eye exam 01/20/2021 WNL - performed at [facility], [facility], follow up in 1 year.  Patient was given a packet eye examination form to take with her to her next  appointment.  Primary osteoarthritis of both hands: X-rays of both hands were obtained on 03/13/2021 and were consistent with erosive rheumatoid arthritis and osteoarthritis overlap.  Primary osteoarthritis of both feet: X-rays of both feet were updated at her last office visit on 03/13/2021 and were consistent with erosive rheumatoid arthritis and osteoarthritis overlap.  Other medical conditions are listed as follows:   Psoriasis: Improving.  Xerosis on elbows but no plaques of psoriasis noted.  Hyperpigmentation  Acquired hypothyroidism    Orders: No orders of the defined types were placed in this encounter.  Meds ordered this encounter  Medications   hydroxychloroquine (PLAQUENIL) 200 MG tablet    Sig: TAKE 1 TABLET BY MOUTH 2 TIMES DAILY MONDAY THROUGH FRIDAY    Dispense:  120 tablet    Refill:  0      Follow-Up Instructions: Return in about 5 months (around 01/14/2022) for Rheumatoid arthritis, Osteoarthritis.   Ofilia Neas, PA-C  Note - This record has been created using Dragon software.  Chart creation errors have been sought, but may not always  have been located. Such creation errors do not reflect on  the standard of medical care.

## 2021-08-14 ENCOUNTER — Encounter: Payer: Self-pay | Admitting: Physician Assistant

## 2021-08-14 ENCOUNTER — Other Ambulatory Visit (HOSPITAL_COMMUNITY): Payer: Self-pay

## 2021-08-14 ENCOUNTER — Ambulatory Visit: Payer: 59 | Admitting: Physician Assistant

## 2021-08-14 VITALS — BP 105/73 | HR 85 | Ht 65.0 in | Wt 153.2 lb

## 2021-08-14 DIAGNOSIS — M19071 Primary osteoarthritis, right ankle and foot: Secondary | ICD-10-CM

## 2021-08-14 DIAGNOSIS — M0579 Rheumatoid arthritis with rheumatoid factor of multiple sites without organ or systems involvement: Secondary | ICD-10-CM | POA: Diagnosis not present

## 2021-08-14 DIAGNOSIS — E039 Hypothyroidism, unspecified: Secondary | ICD-10-CM

## 2021-08-14 DIAGNOSIS — M19041 Primary osteoarthritis, right hand: Secondary | ICD-10-CM

## 2021-08-14 DIAGNOSIS — L409 Psoriasis, unspecified: Secondary | ICD-10-CM | POA: Diagnosis not present

## 2021-08-14 DIAGNOSIS — E038 Other specified hypothyroidism: Secondary | ICD-10-CM

## 2021-08-14 DIAGNOSIS — L819 Disorder of pigmentation, unspecified: Secondary | ICD-10-CM

## 2021-08-14 DIAGNOSIS — M19072 Primary osteoarthritis, left ankle and foot: Secondary | ICD-10-CM | POA: Diagnosis not present

## 2021-08-14 DIAGNOSIS — M19042 Primary osteoarthritis, left hand: Secondary | ICD-10-CM

## 2021-08-14 DIAGNOSIS — L818 Other specified disorders of pigmentation: Secondary | ICD-10-CM | POA: Diagnosis not present

## 2021-08-14 DIAGNOSIS — Z79899 Other long term (current) drug therapy: Secondary | ICD-10-CM | POA: Diagnosis not present

## 2021-08-14 MED ORDER — LEVOTHYROXINE SODIUM 25 MCG PO TABS
ORAL_TABLET | ORAL | 0 refills | Status: DC
  Filled 2021-08-14: qty 90, 90d supply, fill #0

## 2021-08-14 MED ORDER — LEVOTHYROXINE SODIUM 25 MCG PO TABS
ORAL_TABLET | ORAL | 2 refills | Status: DC
  Filled 2021-08-14: qty 30, 30d supply, fill #0

## 2021-08-14 MED ORDER — PROGESTERONE MICRONIZED 100 MG PO CAPS
ORAL_CAPSULE | ORAL | 0 refills | Status: DC
  Filled 2021-08-14: qty 90, 90d supply, fill #0

## 2021-08-14 MED ORDER — HYDROXYCHLOROQUINE SULFATE 200 MG PO TABS
ORAL_TABLET | ORAL | 0 refills | Status: DC
  Filled 2021-08-14: qty 120, 84d supply, fill #0

## 2021-08-14 MED ORDER — THYROID 90 MG PO TABS
ORAL_TABLET | ORAL | 1 refills | Status: DC
  Filled 2021-08-14: qty 30, 30d supply, fill #0

## 2021-08-14 NOTE — Patient Instructions (Signed)
Standing Labs We placed an order today for your standing lab work.   Please have your standing labs drawn in 5 months   If possible, please have your labs drawn 2 weeks prior to your appointment so that the provider can discuss your results at your appointment.  Please note that you may see your imaging and lab results in [place] before we have reviewed them. We may be awaiting multiple results to interpret others before contacting you. Please allow our office up to 72 hours to thoroughly review all of the results before contacting the office for clarification of your results.  We have open lab daily: Monday through Thursday from 1:30-4:30 PM and Friday from 1:30-4:00 PM at the office of Dr. Bo Merino, [facility].   Please be advised, all patients with office appointments requiring lab work will take precedent over walk-in lab work.  If possible, please come for your lab work on Monday and Friday afternoons, as you may experience shorter wait times. The office is located at [address] [place], [place], [place] [address] No appointment is necessary.   Labs are drawn by [facility]. Please bring your co-pay at the time of your lab draw.  You may receive a bill from [facility] for your lab work.  Please note if you are on Hydroxychloroquine and and an order has been placed for a Hydroxychloroquine level, you will need to have it drawn 4 hours or more after your last dose.  If you wish to have your labs drawn at another location, please call the office 24 hours in advance to send orders.  If you have any questions regarding directions or hours of operation,  please call [phone].   As a reminder, please drink plenty of water prior to coming for your lab work. Thanks!

## 2021-09-05 ENCOUNTER — Other Ambulatory Visit (HOSPITAL_COMMUNITY): Payer: Self-pay

## 2021-09-05 MED ORDER — LEVOTHYROXINE SODIUM 25 MCG PO TABS
ORAL_TABLET | ORAL | 3 refills | Status: DC
  Filled 2021-09-05: qty 90, 90d supply, fill #0
  Filled 2021-12-13: qty 90, 90d supply, fill #1
  Filled 2022-06-25: qty 90, 90d supply, fill #2

## 2021-09-05 MED ORDER — PROGESTERONE MICRONIZED 100 MG PO CAPS
ORAL_CAPSULE | ORAL | 3 refills | Status: DC
  Filled 2021-09-05 – 2021-12-13 (×2): qty 90, 90d supply, fill #0
  Filled 2022-04-01: qty 90, 90d supply, fill #1
  Filled 2022-07-20: qty 90, 90d supply, fill #2

## 2021-09-05 MED ORDER — NP THYROID 90 MG PO TABS
ORAL_TABLET | ORAL | 3 refills | Status: DC
  Filled 2021-09-05: qty 90, 90d supply, fill #0
  Filled 2021-12-13: qty 90, 90d supply, fill #1
  Filled 2022-04-01: qty 90, 90d supply, fill #2
  Filled 2022-07-20: qty 90, 90d supply, fill #3

## 2021-09-08 ENCOUNTER — Other Ambulatory Visit (HOSPITAL_COMMUNITY): Payer: Self-pay

## 2021-09-17 DIAGNOSIS — R7989 Other specified abnormal findings of blood chemistry: Secondary | ICD-10-CM | POA: Diagnosis not present

## 2021-09-17 DIAGNOSIS — R6882 Decreased libido: Secondary | ICD-10-CM | POA: Diagnosis not present

## 2021-09-17 DIAGNOSIS — Z77120 Contact with and (suspected) exposure to mold (toxic): Secondary | ICD-10-CM | POA: Diagnosis not present

## 2021-09-17 DIAGNOSIS — R790 Abnormal level of blood mineral: Secondary | ICD-10-CM | POA: Diagnosis not present

## 2021-09-17 DIAGNOSIS — M0579 Rheumatoid arthritis with rheumatoid factor of multiple sites without organ or systems involvement: Secondary | ICD-10-CM | POA: Diagnosis not present

## 2021-09-17 DIAGNOSIS — E559 Vitamin D deficiency, unspecified: Secondary | ICD-10-CM | POA: Diagnosis not present

## 2021-09-17 DIAGNOSIS — E538 Deficiency of other specified B group vitamins: Secondary | ICD-10-CM | POA: Diagnosis not present

## 2021-09-17 DIAGNOSIS — R5383 Other fatigue: Secondary | ICD-10-CM | POA: Diagnosis not present

## 2021-09-17 DIAGNOSIS — E039 Hypothyroidism, unspecified: Secondary | ICD-10-CM | POA: Diagnosis not present

## 2021-09-24 ENCOUNTER — Ambulatory Visit (INDEPENDENT_AMBULATORY_CARE_PROVIDER_SITE_OTHER): Payer: 59

## 2021-09-24 DIAGNOSIS — Z1231 Encounter for screening mammogram for malignant neoplasm of breast: Secondary | ICD-10-CM

## 2021-09-25 ENCOUNTER — Ambulatory Visit: Payer: 59

## 2021-11-03 ENCOUNTER — Other Ambulatory Visit (HOSPITAL_COMMUNITY): Payer: Self-pay

## 2021-11-03 ENCOUNTER — Other Ambulatory Visit: Payer: Self-pay | Admitting: Physician Assistant

## 2021-11-03 DIAGNOSIS — M0579 Rheumatoid arthritis with rheumatoid factor of multiple sites without organ or systems involvement: Secondary | ICD-10-CM

## 2021-11-03 MED ORDER — HYDROXYCHLOROQUINE SULFATE 200 MG PO TABS
ORAL_TABLET | ORAL | 0 refills | Status: DC
  Filled 2021-11-03: qty 120, 84d supply, fill #0

## 2021-11-03 NOTE — Telephone Encounter (Signed)
Next Visit: 02/03/2022  Last Visit: 08/14/2021  Labs: 07/25/2021 Eosinophils Absolute 7  Eye exam: 01/20/2021 WNL    Current Dose per office note 08/14/2021: Plaquenil 200 mg 1 tablet by mouth twice daily Monday through Friday.  DX: Rheumatoid arthritis with rheumatoid factor of multiple sites without organ or systems involvement   Last Fill: 08/14/2021  Okay to refill Plaquenil?

## 2021-12-13 ENCOUNTER — Other Ambulatory Visit (HOSPITAL_COMMUNITY): Payer: Self-pay

## 2021-12-15 ENCOUNTER — Other Ambulatory Visit (HOSPITAL_COMMUNITY): Payer: Self-pay

## 2021-12-17 ENCOUNTER — Other Ambulatory Visit (HOSPITAL_COMMUNITY): Payer: Self-pay

## 2021-12-18 ENCOUNTER — Other Ambulatory Visit (HOSPITAL_COMMUNITY): Payer: Self-pay

## 2021-12-22 DIAGNOSIS — A493 Mycoplasma infection, unspecified site: Secondary | ICD-10-CM | POA: Diagnosis not present

## 2021-12-22 DIAGNOSIS — R6882 Decreased libido: Secondary | ICD-10-CM | POA: Diagnosis not present

## 2021-12-22 DIAGNOSIS — E538 Deficiency of other specified B group vitamins: Secondary | ICD-10-CM | POA: Diagnosis not present

## 2021-12-22 DIAGNOSIS — R5383 Other fatigue: Secondary | ICD-10-CM | POA: Diagnosis not present

## 2021-12-22 DIAGNOSIS — R790 Abnormal level of blood mineral: Secondary | ICD-10-CM | POA: Diagnosis not present

## 2021-12-22 DIAGNOSIS — E559 Vitamin D deficiency, unspecified: Secondary | ICD-10-CM | POA: Diagnosis not present

## 2021-12-22 DIAGNOSIS — E039 Hypothyroidism, unspecified: Secondary | ICD-10-CM | POA: Diagnosis not present

## 2021-12-31 ENCOUNTER — Other Ambulatory Visit (HOSPITAL_COMMUNITY): Payer: Self-pay

## 2022-01-03 ENCOUNTER — Other Ambulatory Visit (HOSPITAL_COMMUNITY): Payer: Self-pay

## 2022-01-20 NOTE — Progress Notes (Signed)
Office Visit Note  Patient: Mariah Palmer             Date of Birth: 1975-04-02           MRN: [id number]             PCP: Samuel Bouche, NP Referring: Samuel Bouche, NP Visit Date: 02/03/2022 Occupation: '@GUAROCC'$ @  Subjective:  Patient management  History of Present Illness: Shalita Notte is a 46 y.o. female with history of seropositive erosive rheumatoid arthritis.  She states that she had a recent flare after doing vacuum.  She was having pain and swelling in her right wrist joint which resolved.  She has had intermittent discomfort in her right shoulder joint.  She states overall she has been doing well on hydroxychloroquine.  She takes total 10 tablets of hydroxychloroquine over the whole week.  She states that her rheumatoid arthritis is well controlled and she is not interested in more aggressive therapy at this point.  Activities of Daily Living:  Patient reports morning stiffness for 0 minutes.   Patient Denies nocturnal pain.  Difficulty dressing/grooming: Denies Difficulty climbing stairs: Denies Difficulty getting out of chair: Denies Difficulty using hands for taps, buttons, cutlery, and/or writing: Denies  Review of Systems  Constitutional:  Positive for fatigue.  HENT:  Negative for mouth sores and mouth dryness.   Eyes:  Negative for dryness.  Respiratory:  Negative for shortness of breath.   Cardiovascular:  Negative for chest pain and palpitations.  Gastrointestinal:  Negative for blood in stool, constipation and diarrhea.  Endocrine: Negative for increased urination.  Genitourinary:  Negative for involuntary urination.  Musculoskeletal:  Negative for joint pain, gait problem, joint pain, joint swelling, myalgias, muscle weakness, morning stiffness, muscle tenderness and myalgias.  Skin:  Negative for color change, rash, hair loss and sensitivity to sunlight.  Allergic/Immunologic: Negative for susceptible to infections.  Neurological:  Negative for dizziness and  headaches.  Hematological:  Negative for swollen glands.  Psychiatric/Behavioral:  Negative for depressed mood and sleep disturbance. The patient is not nervous/anxious.     PMFS History:  Patient Active Problem List   Diagnosis Date Noted   Irregular menses 05/03/2020   Snoring 05/03/2020   Rheumatoid arthritis involving multiple sites with positive rheumatoid factor ([place]) 09/13/2017   High risk medication use 09/13/2017   Primary osteoarthritis of both hands 09/13/2017   Primary osteoarthritis of both feet 09/13/2017   Rheumatoid factor positive 08/24/2017   Psoriasis 10/24/2016   Seasonal allergic rhinitis due to pollen 12/09/2015   Acquired hypothyroidism 02/03/2012   Alopecia areata 02/03/2012    Past Medical History:  Diagnosis Date   Psoriasis    Rheumatoid arthritis ([place])    Thyroid disease     Family History  Problem Relation Age of Onset   Prostate cancer Father    Heart disease Father    Breast cancer Paternal Aunt    Past Surgical History:  Procedure Laterality Date   DILATION AND CURETTAGE OF UTERUS     TONSILLECTOMY AND ADENOIDECTOMY     Social History   Social History Narrative   Not on file   Immunization History  Administered Date(s) Administered   Influenza-Unspecified 02/03/2012, 01/12/2019, 01/12/2020, 01/09/2021   PFIZER(Purple Top)SARS-COV-2 Vaccination 04/24/2019, 05/16/2019   Tdap 02/03/2007, 05/03/2020     Objective: Vital Signs: BP 122/79 (BP Location: Left Arm, Patient Position: Sitting, Cuff Size: Normal)   Pulse 83   Resp 14   Ht '5\' 5"'$  (1.651 m)  Wt 155 lb 6.4 oz (70.5 kg)   BMI 25.86 kg/m    Physical Exam Vitals and nursing note reviewed.  Constitutional:      Appearance: She is well-developed.  HENT:     Head: Normocephalic and atraumatic.  Eyes:     Conjunctiva/sclera: Conjunctivae normal.  Cardiovascular:     Rate and Rhythm: Normal rate and regular rhythm.     Heart sounds: Normal heart sounds.  Pulmonary:      Effort: Pulmonary effort is normal.     Breath sounds: Normal breath sounds.  Abdominal:     General: Bowel sounds are normal.     Palpations: Abdomen is soft.  Musculoskeletal:     Cervical back: Normal range of motion.  Lymphadenopathy:     Cervical: No cervical adenopathy.  Skin:    General: Skin is warm and dry.     Capillary Refill: Capillary refill takes less than 2 seconds.  Neurological:     Mental Status: She is alert and oriented to person, place, and time.  Psychiatric:        Behavior: Behavior normal.      Musculoskeletal Exam: Cervical spine was in good range of motion.  Shoulder joints, elbow joints, wrist joints with good range of motion.  She has synovitis over several of her MCPs as described below.  Hip joints were in good range of motion.  She had warmth and swelling in the right knee joint and left ankle joint.  There was no tenderness over  MTPs.  CDAI Exam: CDAI Score: 10.6  Patient Global: 0 mm; Provider Global: 6 mm Swollen: 6 ; Tender: 6  Joint Exam 02/03/2022      Right  Left  MCP 1  Swollen Tender     MCP 2  Swollen Tender     MCP 3  Swollen Tender  Swollen Tender  Knee  Swollen Tender     Ankle     Swollen Tender     Investigation: No additional findings.  Imaging: No results found.  Recent Labs: Lab Results  Component Value Date   WBC 7.2 07/25/2021   HGB 13.4 07/25/2021   PLT 278 07/25/2021   NA 140 07/25/2021   K 4.1 07/25/2021   CL 103 07/25/2021   CO2 30 07/25/2021   GLUCOSE 84 07/25/2021   BUN 12 07/25/2021   CREATININE 0.67 07/25/2021   BILITOT 0.3 07/25/2021   ALKPHOS 61 02/26/2020   AST 11 07/25/2021   ALT 9 07/25/2021   PROT 7.7 07/25/2021   ALBUMIN 4.6 02/26/2020   CALCIUM 9.7 07/25/2021   GFRAA 109 05/16/2020   QFTBGOLDPLUS NEGATIVE 08/10/2019    Speciality Comments: PLQ eye exam 01/22/2022  WNL  [facility], f/u 1 year   Procedures:  No procedures performed Allergies: Patient has no known  allergies.   Assessment / Plan:     Visit Diagnoses: Rheumatoid arthritis with rheumatoid factor of multiple sites without organ or systems involvement (HCC) - Erosive rheumatoid arthritis: Patient has severe rheumatoid arthritis with active synovitis involving multiple joints.  I had a detailed discussion with the patient today.  Despite being on Plaquenil she continues to have ongoing pain and swelling.  There contraception methods only condoms and she cannot go on methotrexate.  We had detailed discussion regarding Biologics in the past.  She states she would like to get some information on the Biologics but does not want to start on any medications at this point.  I gave  her information on Enbrel to review.  Systemic complications of rheumatoid arthritis including interstitial lung disease, uveitis and vasculitis were also discussed at length.  Patient voiced understanding.  High risk medication use - Plaquenil 200 mg 1 tablet by mouth twice daily Monday through Friday. -Her eye examination was normal on January 22, 2022.  Plan: CBC with Differential/Platelet, COMPLETE METABOLIC PANEL WITH GFR today and then every 5 months.  Information on immunization was placed in the AVS.  Primary osteoarthritis of both hands - X-rays of both hands were obtained on 03/13/2021 and were consistent with erosive rheumatoid arthritis and osteoarthritis overlap.  She continues to have synovitis in multiple joints as described above.  Patient states she is not having any discomfort.  Primary osteoarthritis of both feet - X-rays of both feet were updated at her last office visit on 03/13/2021 and were consistent with erosive rheumatoid arthritis and osteoarthritis overlap.  She had warmth on palpation over her left ankle joint and also over her right knee joint.  Psoriasis-no active lesions were noted.  Hyperpigmentation  Acquired hypothyroidism  Orders: Orders Placed This Encounter  Procedures   CBC with  Differential/Platelet   COMPLETE METABOLIC PANEL WITH GFR   No orders of the defined types were placed in this encounter.    Follow-Up Instructions: Return in about 5 months (around 07/05/2022) for Rheumatoid arthritis, Osteoarthritis.   Bo Merino, MD  Note - This record has been created using Editor, commissioning.  Chart creation errors have been sought, but may not always  have been located. Such creation errors do not reflect on  the standard of medical care.

## 2022-01-22 ENCOUNTER — Encounter: Payer: Self-pay | Admitting: Rheumatology

## 2022-01-22 DIAGNOSIS — Z79899 Other long term (current) drug therapy: Secondary | ICD-10-CM | POA: Diagnosis not present

## 2022-01-25 ENCOUNTER — Other Ambulatory Visit: Payer: Self-pay | Admitting: Rheumatology

## 2022-01-25 DIAGNOSIS — M0579 Rheumatoid arthritis with rheumatoid factor of multiple sites without organ or systems involvement: Secondary | ICD-10-CM

## 2022-01-26 ENCOUNTER — Other Ambulatory Visit (HOSPITAL_COMMUNITY): Payer: Self-pay

## 2022-01-26 MED ORDER — HYDROXYCHLOROQUINE SULFATE 200 MG PO TABS
200.0000 mg | ORAL_TABLET | Freq: Two times a day (BID) | ORAL | 0 refills | Status: DC
  Filled 2022-01-26: qty 120, 84d supply, fill #0

## 2022-01-26 NOTE — Telephone Encounter (Signed)
Next Visit: 02/03/2022   Last Visit: 08/14/2021   Labs: 07/25/2021 Eosinophils Absolute 7   Eye exam:  01/22/2022  WNL     Current Dose per office note 08/14/2021: Plaquenil 200 mg 1 tablet by mouth twice daily Monday through Friday.   DX: Rheumatoid arthritis with rheumatoid factor of multiple sites without organ or systems involvement    Last Fill: 11/03/2021   Patient to update labs at upcoming appointment on 02/03/2022   Okay to refill Plaquenil?

## 2022-02-03 ENCOUNTER — Encounter: Payer: Self-pay | Admitting: Rheumatology

## 2022-02-03 ENCOUNTER — Ambulatory Visit: Payer: 59 | Attending: Rheumatology | Admitting: Rheumatology

## 2022-02-03 VITALS — BP 122/79 | HR 83 | Resp 14 | Ht 65.0 in | Wt 155.4 lb

## 2022-02-03 DIAGNOSIS — M19042 Primary osteoarthritis, left hand: Secondary | ICD-10-CM | POA: Diagnosis not present

## 2022-02-03 DIAGNOSIS — Z79899 Other long term (current) drug therapy: Secondary | ICD-10-CM

## 2022-02-03 DIAGNOSIS — M19072 Primary osteoarthritis, left ankle and foot: Secondary | ICD-10-CM | POA: Diagnosis not present

## 2022-02-03 DIAGNOSIS — L819 Disorder of pigmentation, unspecified: Secondary | ICD-10-CM | POA: Diagnosis not present

## 2022-02-03 DIAGNOSIS — M0579 Rheumatoid arthritis with rheumatoid factor of multiple sites without organ or systems involvement: Secondary | ICD-10-CM

## 2022-02-03 DIAGNOSIS — E039 Hypothyroidism, unspecified: Secondary | ICD-10-CM

## 2022-02-03 DIAGNOSIS — L409 Psoriasis, unspecified: Secondary | ICD-10-CM

## 2022-02-03 DIAGNOSIS — M19071 Primary osteoarthritis, right ankle and foot: Secondary | ICD-10-CM | POA: Diagnosis not present

## 2022-02-03 DIAGNOSIS — M19041 Primary osteoarthritis, right hand: Secondary | ICD-10-CM

## 2022-02-03 LAB — CBC WITH DIFFERENTIAL/PLATELET
Absolute Monocytes: 393 cells/uL (ref 200–950)
Basophils Absolute: 62 cells/uL (ref 0–200)
Basophils Relative: 0.8 %
Eosinophils Absolute: 23 cells/uL (ref 15–500)
Eosinophils Relative: 0.3 %
HCT: 39 % (ref 35.0–45.0)
Hemoglobin: 13 g/dL (ref 11.7–15.5)
Lymphs Abs: 1478 cells/uL (ref 850–3900)
MCH: 27.9 pg (ref 27.0–33.0)
MCHC: 33.3 g/dL (ref 32.0–36.0)
MCV: 83.7 fL (ref 80.0–100.0)
MPV: 9.4 fL (ref 7.5–12.5)
Monocytes Relative: 5.1 %
Neutro Abs: 5744 cells/uL (ref 1500–7800)
Neutrophils Relative %: 74.6 %
Platelets: 240 10*3/uL (ref 140–400)
RBC: 4.66 10*6/uL (ref 3.80–5.10)
RDW: 12.8 % (ref 11.0–15.0)
Total Lymphocyte: 19.2 %
WBC: 7.7 10*3/uL (ref 3.8–10.8)

## 2022-02-03 LAB — COMPLETE METABOLIC PANEL WITH GFR
AG Ratio: 1.5 (calc) (ref 1.0–2.5)
ALT: 9 U/L (ref 6–29)
AST: 12 U/L (ref 10–30)
Albumin: 4.4 g/dL (ref 3.6–5.1)
Alkaline phosphatase (APISO): 47 U/L (ref 31–125)
BUN: 11 mg/dL (ref 7–25)
CO2: 32 mmol/L (ref 20–32)
Calcium: 9.5 mg/dL (ref 8.6–10.2)
Chloride: 101 mmol/L (ref 98–110)
Creat: 0.7 mg/dL (ref 0.50–0.99)
Globulin: 3 g/dL (calc) (ref 1.9–3.7)
Glucose, Bld: 89 mg/dL (ref 65–99)
Potassium: 4.3 mmol/L (ref 3.5–5.3)
Sodium: 139 mmol/L (ref 135–146)
Total Bilirubin: 0.4 mg/dL (ref 0.2–1.2)
Total Protein: 7.4 g/dL (ref 6.1–8.1)
eGFR: 109 mL/min/{1.73_m2} (ref >=60)

## 2022-02-03 NOTE — Patient Instructions (Addendum)
Vaccines You are taking a medication(s) that can suppress your immune system.  The following immunizations are recommended: Flu annually Covid-19  Td/Tdap (tetanus, diphtheria, pertussis) every 10 years Pneumonia (Prevnar 15 then Pneumovax 23 at least 1 year apart.  Alternatively, can take Prevnar 20 without needing additional dose) Shingrix: 2 doses from 4 weeks to 6 months apart  Please check with your PCP to make sure you are up to date.    Etanercept Injection What is this medication? ETANERCEPT (et a [employer]) treats autoimmune conditions, such as psoriasis and certain types of arthritis. It works by slowing down an overactive immune system. It belongs to a group of medications called TNF inhibitors. This medicine may be used for other purposes; ask your health care provider or pharmacist if you have questions. COMMON BRAND NAME(S): Enbrel What should I tell my care team before I take this medication? They need to know if you have any of these conditions: Bleeding disorder Cancer Diabetes Granulomatosis with polyangiitis Heart failure HIV or AIDS Immune system problems Infection, such as tuberculosis (TB) or other bacterial, fungal or viral infections Liver disease Nervous system problems, such as Guillain-Barre syndrome, multiple sclerosis or seizures Recent or upcoming vaccine An unusual or allergic reaction to etanercept, other medications, food, dyes, or preservatives Pregnant or trying to get pregnant Breastfeeding How should I use this medication? The medication is injected under the skin. You will be taught how to prepare and give it. Take it as directed on the prescription label. Keep taking it unless your care team tells you stop. This medication comes with INSTRUCTIONS FOR USE. Ask your pharmacist for directions on how to use this medication. Read the information carefully. Talk to your pharmacist or care team if you have questions. If you use a pen, be sure to take  off the outer needle cover before using the dose. It is important that you put your used needles and syringes in a special sharps container. Do not put them in a trash can. If you do not have a sharps container, call your pharmacist or care team to get one. A special MedGuide will be given to you by the pharmacist with each prescription and refill. Be sure to read this information carefully each time. Talk to your care team about the use of this medication in children. While it may be prescribed for children as young as 62 years of age for selected conditions, precautions do apply. Overdosage: If you think you have taken too much of this medicine contact a poison control center or emergency room at once. NOTE: This medicine is only for you. Do not share this medicine with others. What if I miss a dose? If you miss a dose, take it as soon as you can. If it is almost time for your next dose, take only that dose. Do not take double or extra doses. What may interact with this medication? Do not take this medication with any of the following: Biologic medications, such as adalimumab, certolizumab, golimumab, infliximab Live vaccines Rilonacept This medication may also interact with the following: Abatacept [place] Biologic medications, such as anifrolumab, baricitinib, belimumab, canakinumab, natalizumab, rituximab, sarilumab, tocilizumab, tofacitinib, upadacitinib, vedolizumab Cyclophosphamide Sulfasalazine This list may not describe all possible interactions. Give your health care provider a list of all the medicines, herbs, non-prescription drugs, or dietary supplements you use. Also tell them if you smoke, drink alcohol, or use illegal drugs. Some items may interact with your medicine. What should I watch for while  using this medication? Visit your care team for regular checks on your progress. Tell your care team if your symptoms do not start to get better or if they get worse. This medication  may increase your risk of getting an infection. Call your care team for advice if you get a fever, chills, sore throat, or other symptoms of a cold or flu. Do not treat yourself. Try to avoid being around people who are sick. If you have not had the measles or chickenpox vaccines, tell your care team right away if you are around someone with these viruses. You will be tested for tuberculosis (TB) before you start this medication. If your care team prescribes any medication for TB, you should start taking the TB medication before starting this medication. Make sure to finish the full course of TB medication. Avoid taking medications that contain aspirin, acetaminophen, ibuprofen, naproxen, or ketoprofen unless instructed by your care team. These medications may hide fever. Talk to your care team about your risk of cancer. You may be more at risk for certain types of cancer if you take this medication. This medication can decrease the response to a vaccine. If you need to get vaccinated, tell your care team if you have received this medication. Extra booster doses may be needed. Talk to your care team to see if a different vaccination schedule is needed. What side effects may I notice from receiving this medication? Side effects that you should report to your care team as soon as possible: Allergic reactions--skin rash, itching, hives, swelling of the face, lips, tongue, or throat Body pain, tingling, or numbness Eye pain, change in vision, vision loss Heart failure--shortness of breath, swelling of the ankles, feet, or hands, sudden weight gain, unusual weakness or fatigue Infection--fever, chills, cough, sore throat, wounds that don't heal, pain or trouble when passing urine, general feeling of discomfort or being unwell Liver injury--right upper belly pain, loss of appetite, nausea, light-colored stool, dark yellow or brown urine, yellowing skin or eyes, unusual weakness or fatigue Low red blood cell  level--unusual weakness or fatigue, dizziness, headache, trouble breathing Lupus-like syndrome--joint pain, swelling, or stiffness, butterfly-shaped rash on the face, rashes that get worse in the sun, fever, unusual weakness or fatigue New or worsening psoriasis--rash with itchy, scaly patches Seizures Unusual bruising or bleeding Weakness in arms and legs Side effects that usually do not require medical attention (report to your care team if they continue or are bothersome): Headache Pain, redness, or irritation at injection site Sinus pain or pressure around the face or forehead This list may not describe all possible side effects. Call your doctor for medical advice about side effects. You may report side effects to FDA at 1-800-FDA-1088. Where should I keep my medication? Keep out of the reach of children and pets. See product for storage information. Each product may have different instructions. Get rid of any unused medication after the expiration date. To get rid of medications that are no longer needed or have expired: Take the medication to a medication take-back program. Check with your pharmacy or law enforcement to find a location. If you cannot return the medication, ask your pharmacist or care team how to get rid of this medication safely. NOTE: This sheet is a summary. It may not cover all possible information. If you have questions about this medicine, talk to your doctor, pharmacist, or health care provider.  2023 Elsevier/Gold Standard (2021-09-10 00:00:00)

## 2022-02-04 NOTE — Progress Notes (Signed)
CBC and CMP normal

## 2022-02-18 ENCOUNTER — Other Ambulatory Visit (HOSPITAL_COMMUNITY): Payer: Self-pay

## 2022-02-18 ENCOUNTER — Other Ambulatory Visit: Payer: Self-pay | Admitting: Medical-Surgical

## 2022-02-18 MED ORDER — FLUTICASONE PROPIONATE 50 MCG/ACT NA SUSP
2.0000 | NASAL | 3 refills | Status: DC | PRN
  Filled 2022-02-18: qty 16, 30d supply, fill #0

## 2022-03-19 DIAGNOSIS — D1801 Hemangioma of skin and subcutaneous tissue: Secondary | ICD-10-CM | POA: Diagnosis not present

## 2022-03-19 DIAGNOSIS — L409 Psoriasis, unspecified: Secondary | ICD-10-CM | POA: Diagnosis not present

## 2022-03-19 DIAGNOSIS — D2262 Melanocytic nevi of left upper limb, including shoulder: Secondary | ICD-10-CM | POA: Diagnosis not present

## 2022-03-19 DIAGNOSIS — D2261 Melanocytic nevi of right upper limb, including shoulder: Secondary | ICD-10-CM | POA: Diagnosis not present

## 2022-03-19 DIAGNOSIS — D225 Melanocytic nevi of trunk: Secondary | ICD-10-CM | POA: Diagnosis not present

## 2022-03-19 DIAGNOSIS — D2272 Melanocytic nevi of left lower limb, including hip: Secondary | ICD-10-CM | POA: Diagnosis not present

## 2022-03-19 DIAGNOSIS — D2271 Melanocytic nevi of right lower limb, including hip: Secondary | ICD-10-CM | POA: Diagnosis not present

## 2022-03-19 DIAGNOSIS — L814 Other melanin hyperpigmentation: Secondary | ICD-10-CM | POA: Diagnosis not present

## 2022-04-01 ENCOUNTER — Other Ambulatory Visit (HOSPITAL_COMMUNITY): Payer: Self-pay

## 2022-04-01 ENCOUNTER — Other Ambulatory Visit: Payer: Self-pay

## 2022-04-16 DIAGNOSIS — R7989 Other specified abnormal findings of blood chemistry: Secondary | ICD-10-CM | POA: Diagnosis not present

## 2022-04-16 DIAGNOSIS — E538 Deficiency of other specified B group vitamins: Secondary | ICD-10-CM | POA: Diagnosis not present

## 2022-04-16 DIAGNOSIS — R6882 Decreased libido: Secondary | ICD-10-CM | POA: Diagnosis not present

## 2022-04-16 DIAGNOSIS — E559 Vitamin D deficiency, unspecified: Secondary | ICD-10-CM | POA: Diagnosis not present

## 2022-04-16 DIAGNOSIS — A493 Mycoplasma infection, unspecified site: Secondary | ICD-10-CM | POA: Diagnosis not present

## 2022-04-16 DIAGNOSIS — R5383 Other fatigue: Secondary | ICD-10-CM | POA: Diagnosis not present

## 2022-04-16 DIAGNOSIS — M0579 Rheumatoid arthritis with rheumatoid factor of multiple sites without organ or systems involvement: Secondary | ICD-10-CM | POA: Diagnosis not present

## 2022-04-16 DIAGNOSIS — R790 Abnormal level of blood mineral: Secondary | ICD-10-CM | POA: Diagnosis not present

## 2022-04-16 DIAGNOSIS — E039 Hypothyroidism, unspecified: Secondary | ICD-10-CM | POA: Diagnosis not present

## 2022-04-26 ENCOUNTER — Other Ambulatory Visit: Payer: Self-pay | Admitting: Rheumatology

## 2022-04-26 DIAGNOSIS — M0579 Rheumatoid arthritis with rheumatoid factor of multiple sites without organ or systems involvement: Secondary | ICD-10-CM

## 2022-04-27 ENCOUNTER — Other Ambulatory Visit: Payer: Self-pay

## 2022-04-27 ENCOUNTER — Other Ambulatory Visit (HOSPITAL_COMMUNITY): Payer: Self-pay

## 2022-04-27 MED ORDER — HYDROXYCHLOROQUINE SULFATE 200 MG PO TABS
200.0000 mg | ORAL_TABLET | Freq: Two times a day (BID) | ORAL | 0 refills | Status: DC
  Filled 2022-04-27: qty 120, 84d supply, fill #0

## 2022-04-27 NOTE — Telephone Encounter (Signed)
Next Visit: 07/14/2022  Last Visit: 02/03/2022  Labs: 02/03/2022 CBC and CMP normal.   Eye exam: 01/22/2022   Current Dose per office note on 02/03/2022: Plaquenil 200 mg 1 tablet by mouth twice daily Monday through Friday.   FB:[id number] arthritis with rheumatoid factor of multiple sites without organ or systems involvement   Last Fill: 01/26/2022  Okay to refill Plaquenil?

## 2022-06-26 ENCOUNTER — Other Ambulatory Visit (HOSPITAL_COMMUNITY): Payer: Self-pay

## 2022-06-30 ENCOUNTER — Ambulatory Visit: Payer: 59 | Admitting: Physician Assistant

## 2022-06-30 NOTE — Progress Notes (Unsigned)
Office Visit Note  Patient: Mariah Palmer             Date of Birth: Sep 12, 1975           MRN: [id number]             PCP: Christen Butter, NP Referring: Christen Butter, NP Visit Date: 07/14/2022 Occupation: @  Subjective:  Flare  History of Present Illness: Mariah Palmer is a 47 y.o. female with history of seropositive rheumatoid arthritis and osteoarthritis.  Patient remains on Plaquenil 200 mg twice daily Monday through Friday.  She is tolerating Plaquenil without any side effects and has not missed any doses recently.  Patient reports that she is currently having a flare which she attributes to weather changes.  She states that on Sunday she was having pain and swelling in the left ankle which she attributes to being up on her feet more this past weekend.  She is also having swelling and tenderness in her right wrist and both thumbs.  She took ibuprofen on Sunday but otherwise has not been taking any other over-the-counter products and no prednisone recently.  She states that the pain and swelling typically improves throughout the day.  She denies any rheumatoid nodules.  She denies any signs of uveitis or new or worsening pulmonary symptoms.  She does not want to make any medication changes at this time.   Activities of Daily Living:  Patient reports morning stiffness for 0 minutes.   Patient Denies nocturnal pain.  Difficulty dressing/grooming: Denies Difficulty climbing stairs: Denies Difficulty getting out of chair: Denies Difficulty using hands for taps, buttons, cutlery, and/or writing: Denies  Review of Systems  Constitutional:  Positive for fatigue.  HENT:  Negative for mouth sores and mouth dryness.   Eyes:  Negative for dryness.  Respiratory:  Negative for shortness of breath.   Cardiovascular:  Negative for chest pain and palpitations.  Gastrointestinal:  Negative for blood in stool, constipation and diarrhea.  Endocrine: Negative for increased urination.   Genitourinary:  Negative for involuntary urination.  Musculoskeletal:  Positive for joint pain, joint pain and joint swelling. Negative for gait problem, myalgias, muscle weakness, morning stiffness, muscle tenderness and myalgias.  Skin:  Negative for color change, rash, hair loss and sensitivity to sunlight.  Allergic/Immunologic: Negative for susceptible to infections.  Neurological:  Negative for dizziness and headaches.  Hematological:  Negative for swollen glands.  Psychiatric/Behavioral:  Positive for sleep disturbance. Negative for depressed mood. The patient is not nervous/anxious.     PMFS History:  Patient Active Problem List   Diagnosis Date Noted   Irregular menses 05/03/2020   Snoring 05/03/2020   Rheumatoid arthritis involving multiple sites with positive rheumatoid factor 09/13/2017   High risk medication use 09/13/2017   Primary osteoarthritis of both hands 09/13/2017   Primary osteoarthritis of both feet 09/13/2017   Rheumatoid factor positive 08/24/2017   Psoriasis 10/24/2016   Seasonal allergic rhinitis due to pollen 12/09/2015   Acquired hypothyroidism 02/03/2012   Alopecia areata 02/03/2012    Past Medical History:  Diagnosis Date   Psoriasis    Rheumatoid arthritis    Thyroid disease     Family History  Problem Relation Age of Onset   Prostate cancer Father    Heart disease Father    Breast cancer Paternal Aunt    Past Surgical History:  Procedure Laterality Date   DILATION AND CURETTAGE OF UTERUS     TONSILLECTOMY AND ADENOIDECTOMY  Social History   Social History Narrative   Not on file   Immunization History  Administered Date(s) Administered   Influenza-Unspecified 02/03/2012, 01/12/2019, 01/12/2020, 01/09/2021   PFIZER(Purple Top)SARS-COV-2 Vaccination 04/24/2019, 05/16/2019   Tdap 02/03/2007, 05/03/2020     Objective: Vital Signs: BP 111/72 (BP Location: Left Arm, Patient Position: Sitting, Cuff Size: Normal)   Pulse 81   Resp  14   Ht 5\' 5"  (1.651 m)   Wt 153 lb 3.2 oz (69.5 kg)   BMI 25.49 kg/m    Physical Exam Vitals and nursing note reviewed.  Constitutional:      Appearance: She is well-developed.  HENT:     Head: Normocephalic and atraumatic.  Eyes:     Conjunctiva/sclera: Conjunctivae normal.  Cardiovascular:     Rate and Rhythm: Normal rate and regular rhythm.     Heart sounds: Normal heart sounds.  Pulmonary:     Effort: Pulmonary effort is normal.     Breath sounds: Normal breath sounds.  Abdominal:     General: Bowel sounds are normal.     Palpations: Abdomen is soft.  Musculoskeletal:     Cervical back: Normal range of motion.  Lymphadenopathy:     Cervical: No cervical adenopathy.  Skin:    General: Skin is warm and dry.     Capillary Refill: Capillary refill takes less than 2 seconds.  Neurological:     Mental Status: She is alert and oriented to person, place, and time.  Psychiatric:        Behavior: Behavior normal.      Musculoskeletal Exam: C-spine, thoracic spine, lumbar spine have good range of motion.  No midline spinal tenderness.  Shoulder joints and elbow joints have good range of motion.  Tenderness and synovitis over the ulnar aspect of her right wrist.  Tenderness and synovitis over bilateral first MCP joints and the left second MCP joint.  Hip joints have good range of motion with no groin pain.  No tenderness over the trochanteric bursa bilaterally.  Knee joints have good range of motion with no warmth or effusion.  Tenderness and synovitis over the left ankle noted.  No tenderness or synovitis over the MTP joints.  Thickening of bilateral first MTP joints noted.  CDAI Exam: CDAI Score: 8.7  Patient Global: 1 mm; Provider Global: 6 mm Swollen: 5 ; Tender: 5  Joint Exam 07/14/2022      Right  Left  Wrist  Swollen Tender     MCP 1  Swollen Tender  Swollen Tender  MCP 2     Swollen Tender  Ankle     Swollen Tender     Investigation: No additional  findings.  Imaging: No results found.  Recent Labs: Lab Results  Component Value Date   WBC 7.7 02/03/2022   HGB 13.0 02/03/2022   PLT 240 02/03/2022   NA 139 02/03/2022   K 4.3 02/03/2022   CL 101 02/03/2022   CO2 32 02/03/2022   GLUCOSE 89 02/03/2022   BUN 11 02/03/2022   CREATININE 0.70 02/03/2022   BILITOT 0.4 02/03/2022   ALKPHOS 61 02/26/2020   AST 12 02/03/2022   ALT 9 02/03/2022   PROT 7.4 02/03/2022   ALBUMIN 4.6 02/26/2020   CALCIUM 9.5 02/03/2022   GFRAA 109 05/16/2020   QFTBGOLDPLUS NEGATIVE 08/10/2019    Speciality Comments: PLQ eye exam 01/22/2022  WNL  [facility], f/u 1 year   Procedures:  No procedures performed Allergies: Patient has no known  allergies.   Assessment / Plan:     Visit Diagnoses: Rheumatoid arthritis with rheumatoid factor of multiple sites without organ or systems involvement - Erosive rheumatoid arthritis: Patient presents today experiencing a flare in her right wrist, both hands, and the left ankle joint.  She attributes her current flare to weather changes as well as being on her feet more this past weekend.  On Sunday she took a dose of ibuprofen due to the severity of discomfort in her left ankle.  She continues to have tenderness and synovitis on the ulnar aspect of her right wrist, bilateral first MCP joints, left first MCP joint, and the left ankle joint.  She is been taking Plaquenil 200 mg 1 tablet by mouth twice daily Monday through Friday.  She continues to tolerate Plaquenil without any side effects and has not missed any doses recently.  Different treatment options were discussed today.  She does not want to make any medication changes at this time.  She feels that this flare is due to weather changes and states that her symptoms get better throughout the day and typically do not require over-the-counter products for symptomatic relief.  She does not feel that her rheumatoid arthritis is affecting her quality of life.   She has not had any signs of uveitis and has not had any new or worsening pulmonary symptoms.  No rheumatoid nodules were noted.  She would benefit from more aggressive management of rheumatoid arthritis but she does not want to make any medication changes at this time.  She will remain on Plaquenil as prescribed.  Refill sent to the pharmacy today.  She was advised to notify us if she continues to have recurrent flares.  She will follow-up in the office in 5 months or sooner if needed.- Plan: hydroxychloroquine (PLAQUENIL) 200 MG tablet  High risk medication use - Plaquenil 200 mg 1 tablet by mouth twice daily Monday through Friday.  CBC and CMP were drawn on 02/03/2022.  Orders for CBC and CMP were released today. PLQ eye exam 01/22/2022 WNL [facility], f/u 1 year.    - Plan: COMPLETE METABOLIC PANEL WITH GFR, CBC with Differential/Platelet  Primary osteoarthritis of both hands: X-rays of both hands were updated on 03/13/2021 which were consistent with erosive rheumatoid arthritis and osteoarthritis overlap.  Patient presents today with tenderness and synovitis over the ulnar aspect of the right wrist and synovitis over bilateral first MCP joints and the left second MCP joint.  Primary osteoarthritis of both feet: Patient presents today experiencing a flare in the left ankle.  She has tenderness and synovitis of the left ankle joint.  No tenderness or synovitis over MTP joints.  Psoriasis: Ear canals and on the extensor surface of both elbows.  Medical conditions are listed as follows:  Hyperpigmentation  Acquired hypothyroidism  Orders: Orders Placed This Encounter  Procedures   COMPLETE METABOLIC PANEL WITH GFR   CBC with Differential/Platelet   Meds ordered this encounter  Medications   hydroxychloroquine (PLAQUENIL) 200 MG tablet    Sig: Take 1 tablet (200 mg total) by mouth 2 (two) times daily Monday through Friday.    Dispense:  120 tablet    Refill:  0      Follow-Up Instructions: Return in about 5 months (around 12/14/2022) for Rheumatoid arthritis, Osteoarthritis.   Gearldine Bienenstock, PA-C  Note - This record has been created using Dragon software.  Chart creation errors have been sought, but may not always  have  been located. Such creation errors do not reflect on  the standard of medical care.

## 2022-07-02 DIAGNOSIS — E039 Hypothyroidism, unspecified: Secondary | ICD-10-CM | POA: Diagnosis not present

## 2022-07-02 DIAGNOSIS — M0579 Rheumatoid arthritis with rheumatoid factor of multiple sites without organ or systems involvement: Secondary | ICD-10-CM | POA: Diagnosis not present

## 2022-07-02 DIAGNOSIS — A493 Mycoplasma infection, unspecified site: Secondary | ICD-10-CM | POA: Diagnosis not present

## 2022-07-02 DIAGNOSIS — E559 Vitamin D deficiency, unspecified: Secondary | ICD-10-CM | POA: Diagnosis not present

## 2022-07-02 DIAGNOSIS — Z77120 Contact with and (suspected) exposure to mold (toxic): Secondary | ICD-10-CM | POA: Diagnosis not present

## 2022-07-14 ENCOUNTER — Other Ambulatory Visit (HOSPITAL_COMMUNITY): Payer: Self-pay

## 2022-07-14 ENCOUNTER — Other Ambulatory Visit: Payer: Self-pay

## 2022-07-14 ENCOUNTER — Encounter: Payer: Self-pay | Admitting: Physician Assistant

## 2022-07-14 ENCOUNTER — Ambulatory Visit: Payer: Commercial Managed Care - PPO | Attending: Physician Assistant | Admitting: Physician Assistant

## 2022-07-14 VITALS — BP 111/72 | HR 81 | Resp 14 | Ht 65.0 in | Wt 153.2 lb

## 2022-07-14 DIAGNOSIS — M0579 Rheumatoid arthritis with rheumatoid factor of multiple sites without organ or systems involvement: Secondary | ICD-10-CM | POA: Diagnosis not present

## 2022-07-14 DIAGNOSIS — Z79899 Other long term (current) drug therapy: Secondary | ICD-10-CM | POA: Diagnosis not present

## 2022-07-14 DIAGNOSIS — M19071 Primary osteoarthritis, right ankle and foot: Secondary | ICD-10-CM

## 2022-07-14 DIAGNOSIS — L409 Psoriasis, unspecified: Secondary | ICD-10-CM

## 2022-07-14 DIAGNOSIS — M19042 Primary osteoarthritis, left hand: Secondary | ICD-10-CM

## 2022-07-14 DIAGNOSIS — M19072 Primary osteoarthritis, left ankle and foot: Secondary | ICD-10-CM

## 2022-07-14 DIAGNOSIS — E039 Hypothyroidism, unspecified: Secondary | ICD-10-CM | POA: Diagnosis not present

## 2022-07-14 DIAGNOSIS — L819 Disorder of pigmentation, unspecified: Secondary | ICD-10-CM | POA: Diagnosis not present

## 2022-07-14 DIAGNOSIS — M19041 Primary osteoarthritis, right hand: Secondary | ICD-10-CM

## 2022-07-14 MED ORDER — HYDROXYCHLOROQUINE SULFATE 200 MG PO TABS
200.0000 mg | ORAL_TABLET | Freq: Two times a day (BID) | ORAL | 0 refills | Status: DC
  Filled 2022-07-14: qty 120, 84d supply, fill #0

## 2022-07-15 LAB — CBC WITH DIFFERENTIAL/PLATELET
Absolute Monocytes: 302 cells/uL (ref 200–950)
Basophils Absolute: 50 cells/uL (ref 0–200)
Basophils Relative: 0.9 %
Eosinophils Absolute: 11 cells/uL — ABNORMAL LOW (ref 15–500)
Eosinophils Relative: 0.2 %
HCT: 39.1 % (ref 35.0–45.0)
Hemoglobin: 12.9 g/dL (ref 11.7–15.5)
Lymphs Abs: 1086 cells/uL (ref 850–3900)
MCH: 27.8 pg (ref 27.0–33.0)
MCHC: 33 g/dL (ref 32.0–36.0)
MCV: 84.3 fL (ref 80.0–100.0)
MPV: 9.4 fL (ref 7.5–12.5)
Monocytes Relative: 5.4 %
Neutro Abs: 4150 cells/uL (ref 1500–7800)
Neutrophils Relative %: 74.1 %
Platelets: 266 10*3/uL (ref 140–400)
RBC: 4.64 10*6/uL (ref 3.80–5.10)
RDW: 12.8 % (ref 11.0–15.0)
Total Lymphocyte: 19.4 %
WBC: 5.6 10*3/uL (ref 3.8–10.8)

## 2022-07-15 LAB — COMPLETE METABOLIC PANEL WITH GFR
AG Ratio: 1.6 (calc) (ref 1.0–2.5)
ALT: 6 U/L (ref 6–29)
AST: 13 U/L (ref 10–35)
Albumin: 4.4 g/dL (ref 3.6–5.1)
Alkaline phosphatase (APISO): 43 U/L (ref 31–125)
BUN: 8 mg/dL (ref 7–25)
CO2: 28 mmol/L (ref 20–32)
Calcium: 9.3 mg/dL (ref 8.6–10.2)
Chloride: 105 mmol/L (ref 98–110)
Creat: 0.77 mg/dL (ref 0.50–0.99)
Globulin: 2.8 g/dL (calc) (ref 1.9–3.7)
Glucose, Bld: 97 mg/dL (ref 65–139)
Potassium: 4.6 mmol/L (ref 3.5–5.3)
Sodium: 139 mmol/L (ref 135–146)
Total Bilirubin: 0.4 mg/dL (ref 0.2–1.2)
Total Protein: 7.2 g/dL (ref 6.1–8.1)
eGFR: 97 mL/min/{1.73_m2} (ref >=60)

## 2022-07-15 NOTE — Progress Notes (Signed)
CMP WNL.  Absolute eosinophils are low.  Rest of CBC WNL.

## 2022-07-20 ENCOUNTER — Other Ambulatory Visit: Payer: Self-pay

## 2022-07-24 ENCOUNTER — Telehealth: Payer: Self-pay | Admitting: Medical-Surgical

## 2022-07-24 NOTE — Telephone Encounter (Signed)
Pt called. She wants to switch to Dr. Tamera Punt because she is a DO.

## 2022-07-24 NOTE — Telephone Encounter (Signed)
That is fine with me.

## 2022-08-03 ENCOUNTER — Encounter: Payer: Self-pay | Admitting: Family Medicine

## 2022-08-03 ENCOUNTER — Ambulatory Visit: Payer: Commercial Managed Care - PPO | Admitting: Family Medicine

## 2022-08-03 VITALS — BP 124/72 | HR 71 | Ht 65.0 in | Wt 152.8 lb

## 2022-08-03 DIAGNOSIS — G4730 Sleep apnea, unspecified: Secondary | ICD-10-CM | POA: Diagnosis not present

## 2022-08-03 NOTE — Assessment & Plan Note (Signed)
High palate noted on exam Mallampati class IV.  Will go ahead and order sleep study as patient does admit to daytime fatigue and sleepiness.  Her husband does tell her she snores however there has been no reports of apnea.

## 2022-08-03 NOTE — Progress Notes (Signed)
Established patient visit   Patient: Mariah Palmer   DOB: 22-Apr-1975   47 y.o. Female  MRN: [id number] Visit Date: 08/03/2022  Today's healthcare provider: Charlton Amor, DO   Chief Complaint  Patient presents with   Establish Care    Transition of care from Christen Butter, NP to [facility], DO. Patient requesting  a test for sleep apnea - symptoms of snoring, daytime sleepiness and fatigue. Patient has upcoming appt schld with [facility] for June 25th.     SUBJECTIVE    Chief Complaint  Patient presents with   Establish Care    Transition of care from Christen Butter, NP to [facility], DO. Patient requesting  a test for sleep apnea - symptoms of snoring, daytime sleepiness and fatigue. Patient has upcoming appt schld with [facility] for June 25th.    HPI  Pt presents for transfer of care.   She has concerns today of snoring and daytime fatigue. She has never been told that she is gasping for air. Says her husband describes it as a "pig snort."   Review of Systems  Constitutional:  Negative for activity change, fatigue and fever.  Respiratory:  Negative for cough and shortness of breath.   Cardiovascular:  Negative for chest pain.  Gastrointestinal:  Negative for abdominal pain.  Genitourinary:  Negative for difficulty urinating.      Current Meds  Medication Sig   fluticasone (FLONASE) 50 MCG/ACT nasal spray Place 2 sprays into both nostrils daily as needed   hydroxychloroquine (PLAQUENIL) 200 MG tablet Take 1 tablet (200 mg total) by mouth 2 (two) times daily Monday through Friday.   levothyroxine (SYNTHROID) 25 MCG tablet TAKE 1 TABLET EVERY MORNING ON AN EMPTY STOMACH, MAY EAT IN 30 TO 60 MINUTES (Patient taking differently: Take 25 mcg by mouth 3 (three) times a week. Take one tablet on Monday, Wednesday, Friday only)   Multiple Vitamins-Calcium (ONE-A-DAY WOMENS PO) Take by mouth.   Probiotic Product (PROBIOTIC PO) Take by mouth daily.   progesterone (PROMETRIUM) 100 MG  capsule TAKE 1 CAPSULE BY MOUTH NIGHTLY ON DAYS 5-28 OF CYCLE   thyroid (NP THYROID) 90 MG tablet Take 1 tablet by mouth in the morning 20 - 30 minutes prior to breakfast.   thyroid (NP THYROID) 90 MG tablet Take 1 tablet by mouth in the morning 20-30 mins prior to breakfast.   triamcinolone ointment (KENALOG) 0.1 % Apply to areas of rash twice daily for 2 - 4 weeks-- avoid use on face and skin folds    OBJECTIVE    BP 124/72   Pulse 71   Ht 5\' 5"  (1.651 m)   Wt 152 lb 12 oz (69.3 kg)   SpO2 100%   BMI 25.42 kg/m   Physical Exam Vitals and nursing note reviewed.  Constitutional:      General: She is not in acute distress.    Appearance: Normal appearance.  HENT:     Head: Normocephalic and atraumatic.     Right Ear: External ear normal.     Left Ear: External ear normal.     Nose: Nose normal.  Eyes:     Conjunctiva/sclera: Conjunctivae normal.  Cardiovascular:     Rate and Rhythm: Normal rate and regular rhythm.  Pulmonary:     Effort: Pulmonary effort is normal.     Breath sounds: Normal breath sounds.  Neurological:     General: No focal deficit present.     Mental Status:  She is alert and oriented to person, place, and time.  Psychiatric:        Mood and Affect: Mood normal.        Behavior: Behavior normal.        Thought Content: Thought content normal.        Judgment: Judgment normal.          ASSESSMENT & PLAN    Problem List Items Addressed This Visit       Respiratory   Sleep apnea - Primary    High palate noted on exam Mallampati class IV.  Will go ahead and order sleep study as patient does admit to daytime fatigue and sleepiness.  Her husband does tell her she snores however there has been no reports of apnea.      Relevant Orders   Ambulatory referral to Sleep Studies    Return in about 2 months (around 10/03/2022) for wellness.      No orders of the defined types were placed in this encounter.   Orders Placed This Encounter  Procedures    Ambulatory referral to Sleep Studies    Referral Priority:   Routine    Referral Type:   Consultation    Referral Reason:   Specialty Services Required    Number of Visits Requested:   1     Charlton Amor, DO  [facility] at [facility] [phone] (phone) [phone] (fax)  [facility]

## 2022-09-07 ENCOUNTER — Other Ambulatory Visit: Payer: Self-pay | Admitting: Family Medicine

## 2022-09-07 DIAGNOSIS — Z1231 Encounter for screening mammogram for malignant neoplasm of breast: Secondary | ICD-10-CM

## 2022-09-08 ENCOUNTER — Encounter: Payer: Self-pay | Admitting: Neurology

## 2022-09-08 ENCOUNTER — Ambulatory Visit (INDEPENDENT_AMBULATORY_CARE_PROVIDER_SITE_OTHER): Payer: Commercial Managed Care - PPO | Admitting: Neurology

## 2022-09-08 VITALS — BP 112/69 | HR 73 | Ht 65.0 in | Wt 152.0 lb

## 2022-09-08 DIAGNOSIS — G478 Other sleep disorders: Secondary | ICD-10-CM | POA: Diagnosis not present

## 2022-09-08 DIAGNOSIS — M069 Rheumatoid arthritis, unspecified: Secondary | ICD-10-CM

## 2022-09-08 DIAGNOSIS — G479 Sleep disorder, unspecified: Secondary | ICD-10-CM

## 2022-09-08 DIAGNOSIS — G4719 Other hypersomnia: Secondary | ICD-10-CM

## 2022-09-08 DIAGNOSIS — Z820 Family history of epilepsy and other diseases of the nervous system: Secondary | ICD-10-CM

## 2022-09-08 DIAGNOSIS — R635 Abnormal weight gain: Secondary | ICD-10-CM

## 2022-09-08 DIAGNOSIS — R0683 Snoring: Secondary | ICD-10-CM

## 2022-09-08 DIAGNOSIS — E663 Overweight: Secondary | ICD-10-CM

## 2022-09-08 NOTE — Patient Instructions (Signed)

## 2022-09-08 NOTE — Progress Notes (Signed)
Subjective:    Patient ID: Mariah Palmer is a 47 y.o. female.  HPI    Mariah Foley, MD, PhD [facility] [address] [address] [place], [place] [address]  Dear Dr. Tamera Punt,   I saw your patient, Mariah Palmer, upon your kind request in my sleep clinic today for initial consultation of her sleep disorder, in particular, concern for underlying obstructive sleep apnea.  The patient is unaccompanied today.  As you know, Mariah Palmer is a 47 year old female with an underlying medical history of psoriasis, rheumatoid arthritis, thyroid disease, and borderline overweight state, who reports snoring and excessive daytime somnolence.  Her Epworth sleepiness score is 10 out of 24, fatigue severity score is 42 out of 63.  I reviewed your office note from 08/03/2022. She sleep with 3 pillows, which helps reduce her snoring. She snores even on her sides, per husband's feedback. Both parents have OSA and use PAP machines. She lives with her husband and has 2 kids, one is a Holiday representative in college, the younger is a Printmaker in high school.  She has had a little bit of weight gain in the past few years, essentially since COVID but overall stable since then.  She had a tonsillectomy and adenoidectomy at age 68.  She is a restless sleeper and does not wake up rested.  She drinks caffeine in the form of coffee, 2 cups in the morning, alcohol infrequently, maybe 1 beer a week, she is a non-smoker.  Bedtime is generally around 10 PM and rise time around 6:30 AM.  She has had some oxygen desaturations according to her Apple Watch.  Her desaturations are intermittent, mostly in the higher 80s.  They have a dog in the household, the dog does not sleep in the bedroom.  They do have a TV in the bedroom but it is not on at night.  She denies nightly nocturia or recurrent morning headaches.  She is followed by rheumatology for her RA and is on Plaquenil.  She works in the Ship broker at [employer].  She  works part-time.  Her Past Medical History Is Significant For: Past Medical History:  Diagnosis Date   Psoriasis    Rheumatoid arthritis (HCC)    Thyroid disease     Her Past Surgical History Is Significant For: Past Surgical History:  Procedure Laterality Date   DILATION AND CURETTAGE OF UTERUS     TONSILLECTOMY AND ADENOIDECTOMY      Her Family History Is Significant For: Family History  Problem Relation Age of Onset   Sleep apnea Mother        cpap started in her 57s   Prostate cancer Father    Heart disease Father    Sleep apnea Father        on cpap since middle age   Skin cancer Father    Breast cancer Paternal Aunt     Her Social History Is Significant For: Social History   Socioeconomic History   Marital status: Married    Spouse name: Not on file   Number of children: Not on file   Years of education: Not on file   Highest education level: Not on file  Occupational History   Not on file  Tobacco Use   Smoking status: Never    Passive exposure: Never   Smokeless tobacco: Never  Vaping Use   Vaping Use: Never used  Substance and Sexual Activity   Alcohol use: Yes  Alcohol/week: 1.0 standard drink of alcohol    Types: 1 Cans of beer per week    Comment: occasionally   Drug use: Never   Sexual activity: Yes    Partners: Male    Birth control/protection: Condom  Other Topics Concern   Not on file  Social History Narrative   Caffeine: 2 cups coffee per day   Social Determinants of Health   Financial Resource Strain: Not on file  Food Insecurity: Not on file  Transportation Needs: Not on file  Physical Activity: Not on file  Stress: Not on file  Social Connections: Not on file    Her Allergies Are:  No Known Allergies:   Her Current Medications Are:  Outpatient Encounter Medications as of 09/08/2022  Medication Sig   hydroxychloroquine (PLAQUENIL) 200 MG tablet Take 1 tablet (200 mg total) by mouth 2 (two) times daily Monday through  Friday.   levothyroxine (SYNTHROID) 25 MCG tablet TAKE 1 TABLET EVERY MORNING ON AN EMPTY STOMACH, MAY EAT IN 30 TO 60 MINUTES (Patient taking differently: Take 25 mcg by mouth 3 (three) times a week. Take one tablet on Monday, Wednesday, Friday only)   Multiple Vitamins-Calcium (ONE-A-DAY WOMENS PO) Take by mouth.   Probiotic Product (PROBIOTIC PO) Take by mouth daily.   progesterone (PROMETRIUM) 100 MG capsule TAKE 1 CAPSULE BY MOUTH NIGHTLY ON DAYS 5-28 OF CYCLE   thyroid (NP THYROID) 90 MG tablet Take 1 tablet by mouth in the morning 20-30 mins prior to breakfast.   triamcinolone ointment (KENALOG) 0.1 % Apply to areas of rash twice daily for 2 - 4 weeks-- avoid use on face and skin folds   fluticasone (FLONASE) 50 MCG/ACT nasal spray Place 2 sprays into both nostrils daily as needed (Patient not taking: Reported on 09/08/2022)   [DISCONTINUED] levothyroxine (SYNTHROID) 25 MCG tablet TAKE 1 TABLET EVERY MORNING ON AN EMPTY STOMACH, MAY EAT IN 30 TO 60 MINUTES (Patient not taking: Reported on 08/03/2022)   [DISCONTINUED] thyroid (NP THYROID) 90 MG tablet Take 1 tablet by mouth in the morning 20 - 30 minutes prior to breakfast.   No facility-administered encounter medications on file as of 09/08/2022.  :   Review of Systems:  Out of a complete 14 point review of systems, all are reviewed and negative with the exception of these symptoms as listed below:  Review of Systems  Neurological:        Patient is here alone for sleep evaluation. She endorses snoring, daytime sleepiness, and fatigue. She will be seeing ENT soon for evaluation. She endorses some trouble with allergies. She states she never feels she has optimal breathing ability. She wears an apple watch which her oxygen which has dropped while sleeping. She has never had a sleep study before. ESS 10 FSS 42.    Objective:  Neurological Exam  Physical Exam Physical Examination:   Vitals:   09/08/22 1357  BP: 112/69  Pulse: 73     General Examination: The patient is a very pleasant 47 y.o. female in no acute distress. She appears well-developed and well-nourished and well groomed.   HEENT: Normocephalic, atraumatic, pupils are equal, round and reactive to light, extraocular tracking is good without limitation to gaze excursion or nystagmus noted. Hearing is grossly intact. Face is symmetric with normal facial animation. Speech is clear with no dysarthria noted. There is no hypophonia. There is no lip, neck/head, jaw or voice tremor. Neck is supple with full range of passive and active motion. There are  no carotid bruits on auscultation. Oropharynx exam reveals: mild mouth dryness, good dental hygiene and mild airway crowding, due to small airway entry, tonsils absent, Mallampati class II.  Mild overbite noted.  Neck circumference 13-3/8 inches.  Tongue protrudes centrally and palate elevates symmetrically.  Chest: Clear to auscultation without wheezing, rhonchi or crackles noted.  Heart: S1+S2+0, regular and normal without murmurs, rubs or gallops noted.   Abdomen: Soft, non-tender and non-distended.  Extremities: There is no pitting edema in the distal lower extremities bilaterally.   Skin: Warm and dry without trophic changes noted.   Musculoskeletal: exam reveals left ankle swelling, wider than right.  Some arthritic changes in the right hand.  Neurologically:  Mental status: The patient is awake, alert and oriented in all 4 spheres. Her immediate and remote memory, attention, language skills and fund of knowledge are appropriate. There is no evidence of aphasia, agnosia, apraxia or anomia. Speech is clear with normal prosody and enunciation. Thought process is linear. Mood is normal and affect is normal.  Cranial nerves II - XII are as described above under HEENT exam.  Motor exam: Normal bulk, strength and tone is noted. There is no obvious action or resting tremor.  Fine motor skills and coordination: grossly  intact.  Cerebellar testing: No dysmetria or intention tremor. There is no truncal or gait ataxia.  Sensory exam: intact to light touch in the upper and lower extremities.  Gait, station and balance: She stands easily. No veering to one side is noted. No leaning to one side is noted. Posture is age-appropriate and stance is narrow based. Gait shows normal stride length and normal pace. No problems turning are noted.   Assessment and Plan:  In summary, Bernyce Punches is a very pleasant 47 y.o.-year old female with an underlying medical history of psoriasis, rheumatoid arthritis, thyroid disease, and borderline overweight state, whose history and physical exam are concerning for sleep disordered breathing, particularly obstructive sleep apnea (OSA). A laboratory attended sleep study is typically considered "gold standard" for evaluation of sleep disordered breathing.   I had a long chat with the patient about my findings and the diagnosis of sleep apnea, particularly OSA, its prognosis and treatment options. We talked about medical/conservative treatments, surgical interventions and non-pharmacological approaches for symptom control. I explained, in particular, the risks and ramifications of untreated moderate to severe OSA, especially with respect to developing cardiovascular disease down the road, including congestive heart failure (CHF), difficult to treat hypertension, cardiac arrhythmias (particularly A-fib), neurovascular complications including TIA, stroke and dementia. Even type 2 diabetes has, in part, been linked to untreated OSA. Symptoms of untreated OSA may include (but may not be limited to) daytime sleepiness, nocturia (i.e. frequent nighttime urination), memory problems, mood irritability and suboptimally controlled or worsening mood disorder such as depression and/or anxiety, lack of energy, lack of motivation, physical discomfort, as well as recurrent headaches, especially morning or nocturnal  headaches. We talked about the importance of maintaining a healthy lifestyle and striving for healthy weight. In addition, we talked about the importance of striving for and maintaining good sleep hygiene. I recommended a sleep study at this time. I outlined the differences between a laboratory attended sleep study which is considered more comprehensive and accurate over the option of a home sleep test (HST); the latter may lead to underestimation of sleep disordered breathing in some instances and does not help with diagnosing upper airway resistance syndrome and is not accurate enough to diagnose primary central sleep apnea  typically. I outlined possible surgical and non-surgical treatment options of OSA, including the use of a positive airway pressure (PAP) device (i.e. CPAP, AutoPAP/APAP or BiPAP in certain circumstances), a custom-made dental device (aka oral appliance, which would require a referral to a specialist dentist or orthodontist typically, and is generally speaking not considered for patients with full dentures or edentulous state), upper airway surgical options, such as traditional UPPP (which is not considered a first-line treatment) or the Inspire device (hypoglossal nerve stimulator, which would involve a referral for consultation with an ENT surgeon, after careful selection, following inclusion criteria - also not first-line treatment). I explained the PAP treatment option to the patient in detail, as this is generally considered first-line treatment.  The patient indicated that she would be willing to try PAP therapy, if the need arises. I explained the importance of being compliant with PAP treatment, not only for insurance purposes but primarily to improve patient's symptoms symptoms, and for the patient's long term health benefit, including to reduce Her cardiovascular risks longer-term.    We will pick up our discussion about the next steps and treatment options after testing.  We will  keep her posted as to the test results by phone call and/or MyChart messaging where possible.  We will plan to follow-up in sleep clinic accordingly as well.  I answered all her questions today and the patient was in agreement.   I encouraged her to call with any interim questions, concerns, problems or updates or email [place] through MyChart.  Generally speaking, sleep test authorizations may take up to 2 weeks, sometimes less, sometimes longer, the patient is encouraged to get in touch with [place] if they do not hear back from the sleep lab staff directly within the next 2 weeks.  Thank you very much for allowing me to participate in the care of this nice patient. If I can be of any further assistance to you please do not hesitate to call me at [phone].  Sincerely,   Mariah Foley, MD, PhD

## 2022-09-22 ENCOUNTER — Other Ambulatory Visit: Payer: Self-pay

## 2022-09-22 ENCOUNTER — Encounter: Payer: Self-pay | Admitting: Neurology

## 2022-09-22 ENCOUNTER — Other Ambulatory Visit (HOSPITAL_COMMUNITY): Payer: Self-pay

## 2022-09-22 DIAGNOSIS — J343 Hypertrophy of nasal turbinates: Secondary | ICD-10-CM | POA: Insufficient documentation

## 2022-09-22 DIAGNOSIS — J3489 Other specified disorders of nose and nasal sinuses: Secondary | ICD-10-CM | POA: Insufficient documentation

## 2022-09-22 DIAGNOSIS — R0683 Snoring: Secondary | ICD-10-CM | POA: Diagnosis not present

## 2022-09-22 MED ORDER — MUPIROCIN 2 % EX OINT
TOPICAL_OINTMENT | CUTANEOUS | 0 refills | Status: DC
  Filled 2022-09-22: qty 22, 14d supply, fill #0

## 2022-09-23 ENCOUNTER — Other Ambulatory Visit: Payer: Self-pay

## 2022-09-30 ENCOUNTER — Ambulatory Visit: Payer: Commercial Managed Care - PPO

## 2022-09-30 DIAGNOSIS — Z1231 Encounter for screening mammogram for malignant neoplasm of breast: Secondary | ICD-10-CM | POA: Diagnosis not present

## 2022-10-05 ENCOUNTER — Other Ambulatory Visit: Payer: Self-pay | Admitting: Family Medicine

## 2022-10-05 ENCOUNTER — Ambulatory Visit (INDEPENDENT_AMBULATORY_CARE_PROVIDER_SITE_OTHER): Payer: Commercial Managed Care - PPO | Admitting: Family Medicine

## 2022-10-05 ENCOUNTER — Encounter: Payer: Self-pay | Admitting: Family Medicine

## 2022-10-05 VITALS — BP 122/79 | HR 95 | Resp 18 | Ht 65.0 in | Wt 150.2 lb

## 2022-10-05 DIAGNOSIS — Z1211 Encounter for screening for malignant neoplasm of colon: Secondary | ICD-10-CM

## 2022-10-05 DIAGNOSIS — R928 Other abnormal and inconclusive findings on diagnostic imaging of breast: Secondary | ICD-10-CM

## 2022-10-05 DIAGNOSIS — Z13220 Encounter for screening for lipoid disorders: Secondary | ICD-10-CM

## 2022-10-05 IMAGING — MG MM DIGITAL SCREENING BILAT W/ TOMO AND CAD
8 series · 9 of 24 positions shown · non-contrast
Comparison: None.

CLINICAL DATA: Screening.

EXAM:
DIGITAL SCREENING BILATERAL MAMMOGRAM WITH TOMOSYNTHESIS AND CAD
TECHNIQUE: Bilateral screening digital craniocaudal and mediolateral oblique
mammograms were obtained. Bilateral screening digital breast
tomosynthesis was performed. The images were evaluated with
computer-aided detection.

[L CC synth-2D]
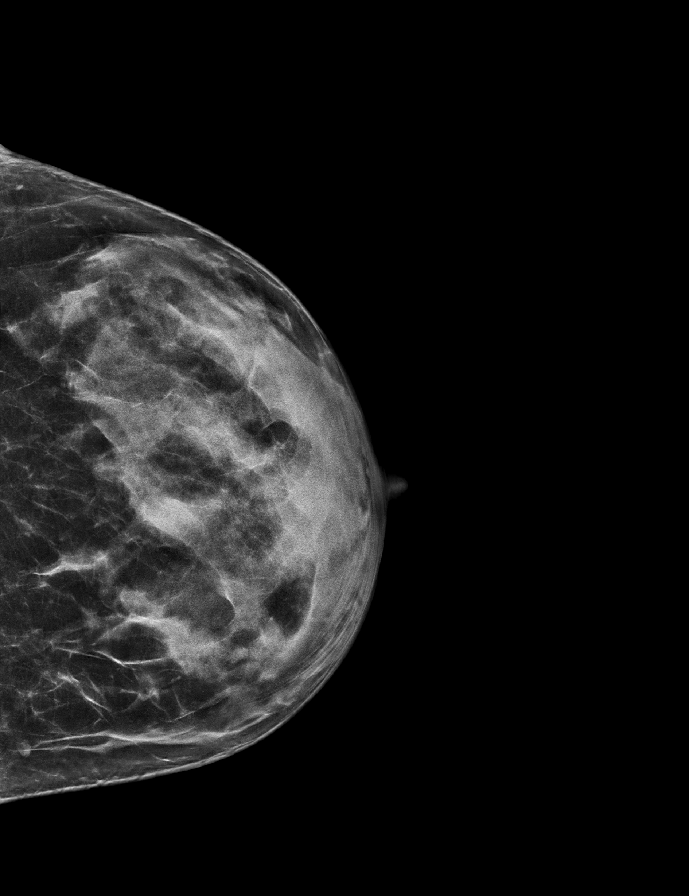

[L MLO synth-2D]
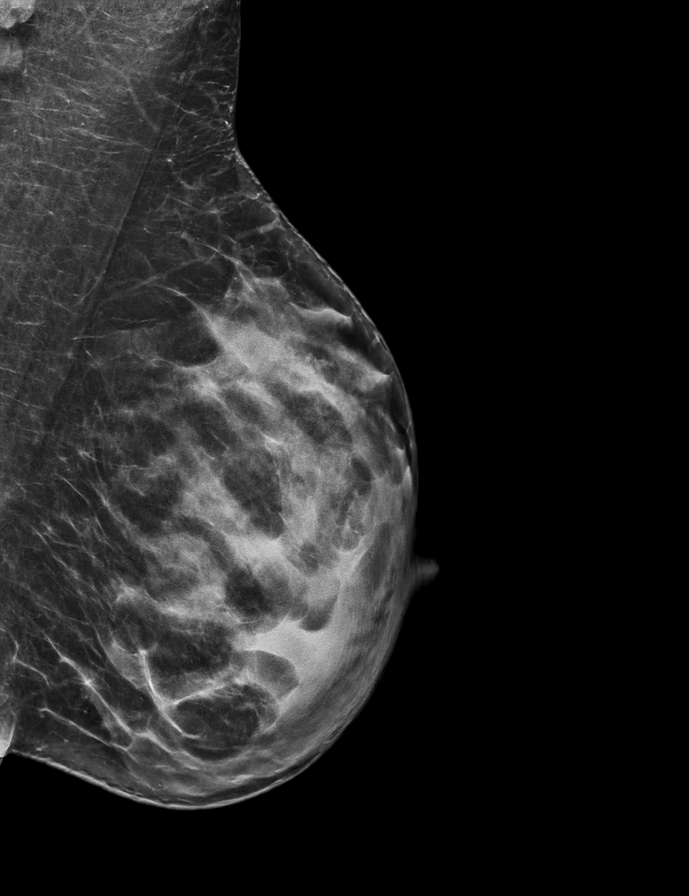

[R CC synth-2D]
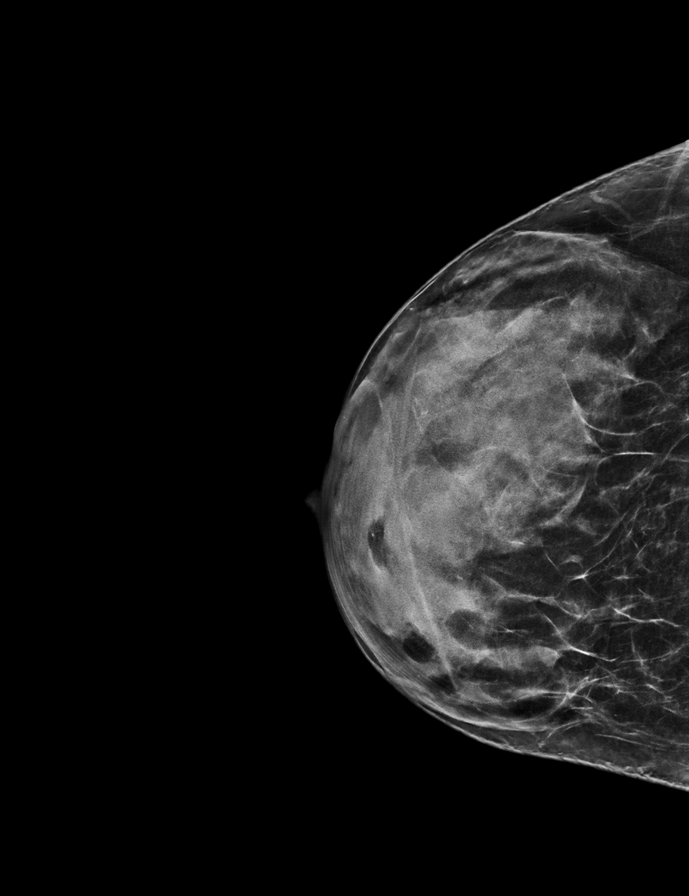

[R MLO synth-2D]
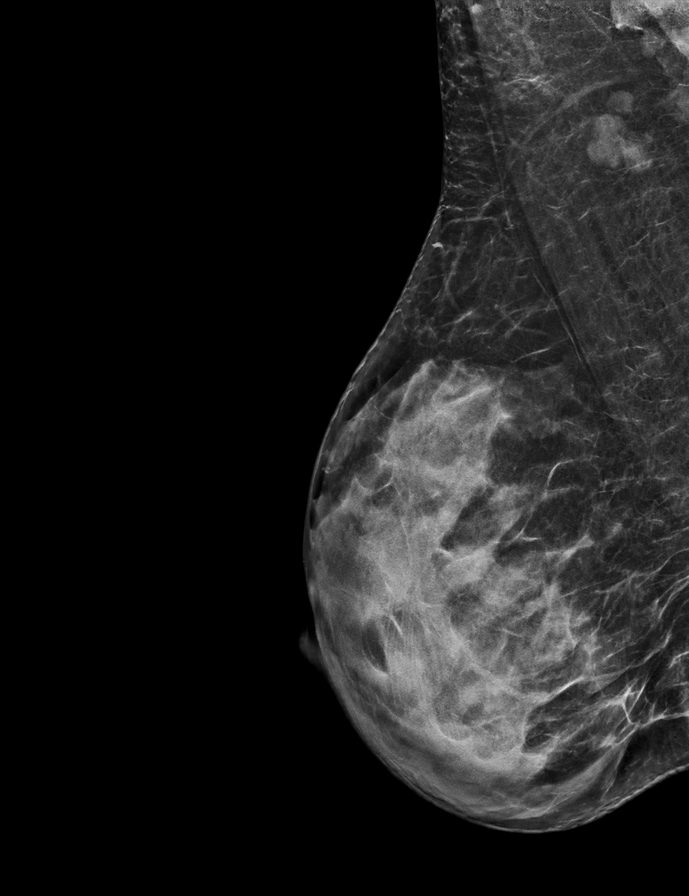

[R MLO tomo · 2 of 62 frames shown]
[frame 21/62]
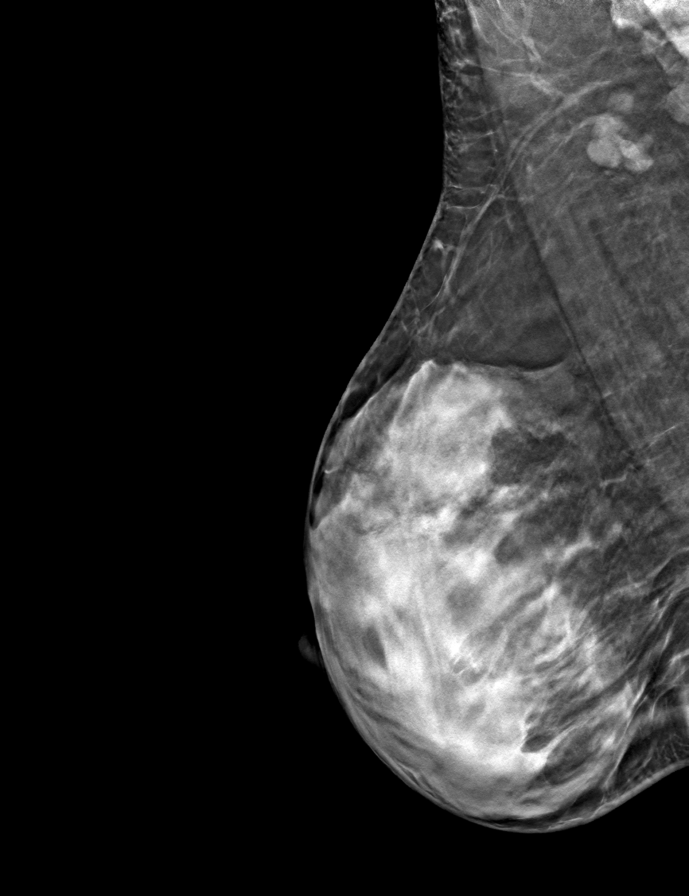
[frame 31/62]
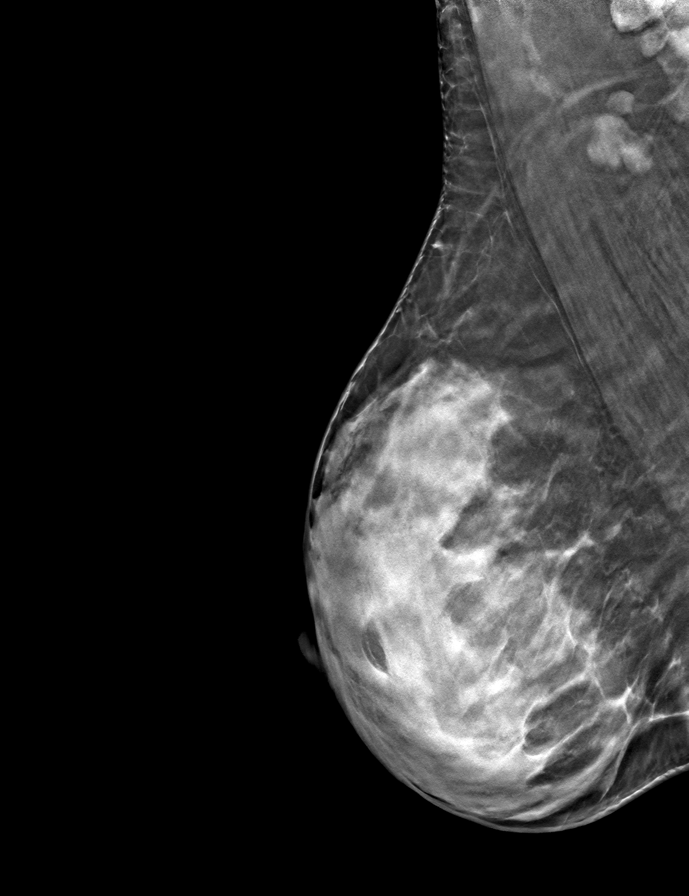

[L MLO tomo · tomo slice 33/64.0]
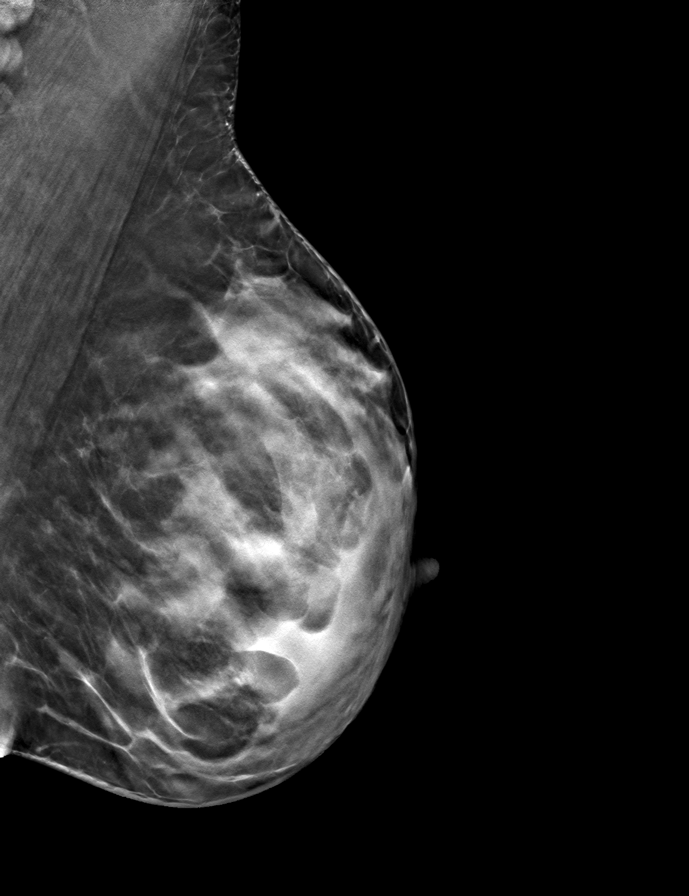

[L CC tomo · tomo slice 31/62.0]
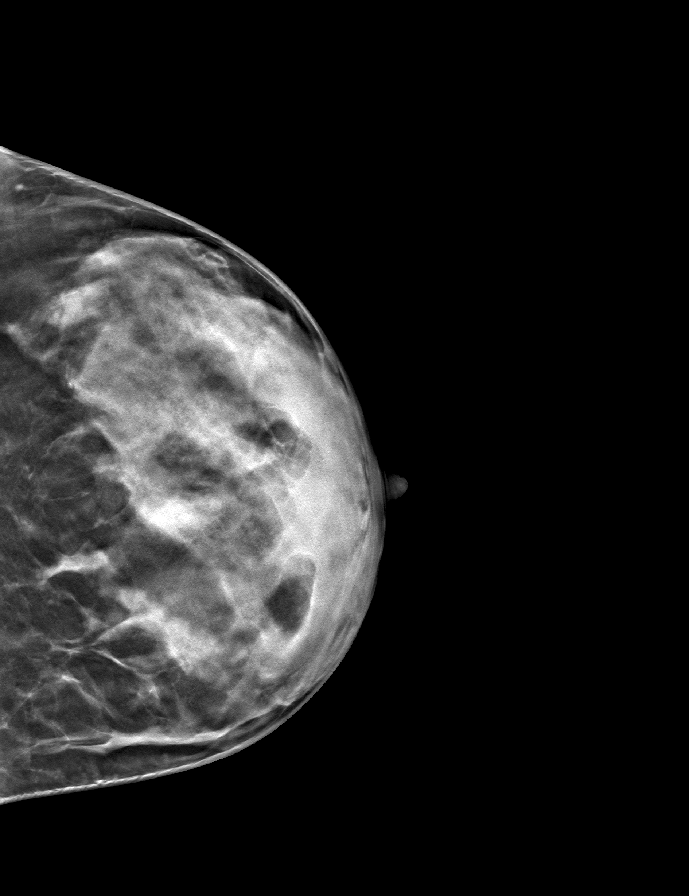

[R CC tomo · tomo slice 30/59.0]
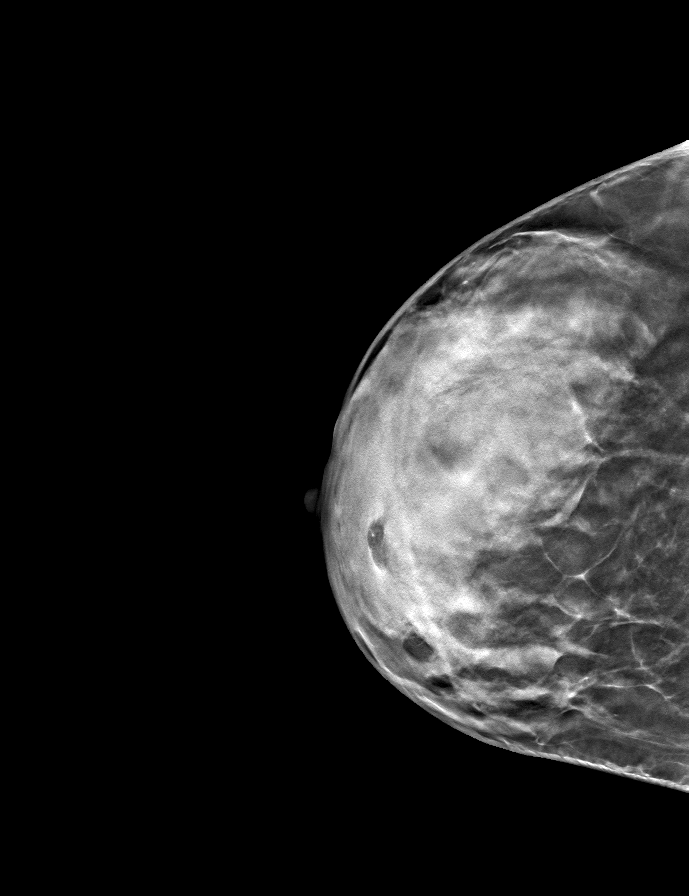

[9 of 24 positions shown; findings below may reference images not displayed]

ACR Breast Density Category d: The breast tissue is extremely dense,
which lowers the sensitivity of mammography.
FINDINGS: There are no findings suspicious for malignancy. The images were
evaluated with computer-aided detection.
IMPRESSION: No mammographic evidence of malignancy. A result letter of this
screening mammogram will be mailed directly to the patient.

RECOMMENDATION:
Screening mammogram in one year. (Code:CA-B-HJO)

BI-RADS CATEGORY  1: Negative.

## 2022-10-05 NOTE — Progress Notes (Signed)
Established patient visit   Patient: Mariah Palmer   DOB: 04-20-1975   47 y.o. Female  MRN: [id number] Visit Date: 10/05/2022  Today's healthcare provider: Charlton Amor, DO   Chief Complaint  Patient presents with   Annual Exam    SUBJECTIVE    Chief Complaint  Patient presents with   Annual Exam   HPI  Has concerns about mammogram.  Has had issues with sleep. Has gone to sleep medicine. Neurology recommend in person sleep study. She went to ENT and structurally everything looks good.   Review of Systems  Constitutional:  Negative for activity change, fatigue and fever.  Respiratory:  Negative for cough and shortness of breath.   Cardiovascular:  Negative for chest pain.  Gastrointestinal:  Negative for abdominal pain.  Genitourinary:  Negative for difficulty urinating.       Current Meds  Medication Sig   fluticasone (FLONASE) 50 MCG/ACT nasal spray Place 2 sprays into both nostrils daily as needed   hydroxychloroquine (PLAQUENIL) 200 MG tablet Take 1 tablet (200 mg total) by mouth 2 (two) times daily Monday through Friday.   levothyroxine (SYNTHROID) 25 MCG tablet TAKE 1 TABLET EVERY MORNING ON AN EMPTY STOMACH, MAY EAT IN 30 TO 60 MINUTES (Patient taking differently: Take 25 mcg by mouth 3 (three) times a week. Take one tablet on Monday, Wednesday, Friday only)   Multiple Vitamins-Calcium (ONE-A-DAY WOMENS PO) Take by mouth.   mupirocin ointment (BACTROBAN) 2 % Apply within both nasal passages twice daily for two weeks.   Probiotic Product (PROBIOTIC PO) Take by mouth daily.   progesterone (PROMETRIUM) 100 MG capsule TAKE 1 CAPSULE BY MOUTH NIGHTLY ON DAYS 5-28 OF CYCLE   thyroid (NP THYROID) 90 MG tablet Take 1 tablet by mouth in the morning 20-30 mins prior to breakfast.   triamcinolone ointment (KENALOG) 0.1 % Apply to areas of rash twice daily for 2 - 4 weeks-- avoid use on face and skin folds    OBJECTIVE    BP 122/79 (BP Location: Right Arm, Patient  Position: Sitting, Cuff Size: Normal)   Pulse 95   Resp 18   Ht 5\' 5"  (1.651 m)   Wt 150 lb 4 oz (68.2 kg)   SpO2 97%   BMI 25.00 kg/m   Physical Exam Vitals and nursing note reviewed.  Constitutional:      General: She is not in acute distress.    Appearance: Normal appearance.  HENT:     Head: Normocephalic and atraumatic.     Right Ear: External ear normal.     Left Ear: External ear normal.     Nose: Nose normal.  Eyes:     Conjunctiva/sclera: Conjunctivae normal.  Cardiovascular:     Rate and Rhythm: Normal rate and regular rhythm.  Pulmonary:     Effort: Pulmonary effort is normal.     Breath sounds: Normal breath sounds.  Neurological:     General: No focal deficit present.     Mental Status: She is alert and oriented to person, place, and time.  Psychiatric:        Mood and Affect: Mood normal.        Behavior: Behavior normal.        Thought Content: Thought content normal.        Judgment: Judgment normal.        ASSESSMENT & PLAN    Problem List Items Addressed This Visit       Other  Abnormal mammogram - Primary    Patient had abnormal mammogram done last week and has no questions and concerns.  We had a long discussion about the reason for her abnormal mammogram.  She does have dense breast tissue.  We will go ahead and get ultrasound.  Patient denies any skin changes, nipple discharge, or redness around her breast.      Other Visit Diagnoses     Screening for colon cancer       Relevant Orders   Cologuard   Screening for lipid disorders       Relevant Orders   Lipid panel       No follow-ups on file.      No orders of the defined types were placed in this encounter.   Orders Placed This Encounter  Procedures   Lipid panel    Order Specific Question:   Has the patient fasted?    Answer:   No    Order Specific Question:   Release to patient    Answer:   Immediate   Cologuard     Charlton Amor, DO  [facility] at [facility] [phone] (phone) [phone] (fax)  [facility]

## 2022-10-05 NOTE — Assessment & Plan Note (Signed)
Patient had abnormal mammogram done last week and has no questions and concerns.  We had a long discussion about the reason for her abnormal mammogram.  She does have dense breast tissue.  We will go ahead and get ultrasound.  Patient denies any skin changes, nipple discharge, or redness around her breast.

## 2022-10-06 LAB — LIPID PANEL
Cholesterol: 167 mg/dL (ref <200)
HDL: 60 mg/dL (ref >=50)
LDL Cholesterol (Calc): 89 mg/dL (calc)
Non-HDL Cholesterol (Calc): 107 mg/dL (calc) (ref <130)
Total CHOL/HDL Ratio: 2.8 (calc) (ref <5.0)
Triglycerides: 88 mg/dL (ref <150)

## 2022-10-09 ENCOUNTER — Ambulatory Visit: Admission: RE | Admit: 2022-10-09 | Payer: Commercial Managed Care - PPO | Source: Ambulatory Visit

## 2022-10-09 ENCOUNTER — Ambulatory Visit
Admission: RE | Admit: 2022-10-09 | Discharge: 2022-10-09 | Disposition: A | Discharge status: Home / Self Care | Payer: Commercial Managed Care - PPO | Source: Ambulatory Visit | Attending: Family Medicine | Admitting: Family Medicine

## 2022-10-09 DIAGNOSIS — R928 Other abnormal and inconclusive findings on diagnostic imaging of breast: Secondary | ICD-10-CM

## 2022-10-16 DIAGNOSIS — Z1211 Encounter for screening for malignant neoplasm of colon: Secondary | ICD-10-CM | POA: Diagnosis not present

## 2022-10-18 ENCOUNTER — Other Ambulatory Visit (HOSPITAL_COMMUNITY): Payer: Self-pay

## 2022-10-18 ENCOUNTER — Other Ambulatory Visit: Payer: Self-pay | Admitting: Physician Assistant

## 2022-10-18 DIAGNOSIS — M0579 Rheumatoid arthritis with rheumatoid factor of multiple sites without organ or systems involvement: Secondary | ICD-10-CM

## 2022-10-19 ENCOUNTER — Other Ambulatory Visit: Payer: Self-pay

## 2022-10-19 ENCOUNTER — Other Ambulatory Visit (HOSPITAL_COMMUNITY): Payer: Self-pay

## 2022-10-19 DIAGNOSIS — F4320 Adjustment disorder, unspecified: Secondary | ICD-10-CM | POA: Diagnosis not present

## 2022-10-19 MED ORDER — THYROID 90 MG PO TABS
ORAL_TABLET | ORAL | 3 refills | Status: DC
  Filled 2022-10-19: qty 90, 90d supply, fill #0
  Filled 2023-01-21: qty 90, 90d supply, fill #1
  Filled 2023-05-13: qty 90, 90d supply, fill #2
  Filled 2023-08-04: qty 90, 90d supply, fill #3

## 2022-10-19 MED ORDER — HYDROXYCHLOROQUINE SULFATE 200 MG PO TABS
200.0000 mg | ORAL_TABLET | Freq: Two times a day (BID) | ORAL | 0 refills | Status: DC
Start: 2022-10-19 — End: 2023-01-12
  Filled 2022-10-19: qty 120, 60d supply, fill #0

## 2022-10-19 NOTE — Telephone Encounter (Signed)
Last Fill: 07/14/2022  Eye exam: 01/22/2022  WNL    Labs: 07/14/2022 CMP WNL Absolute eosinophils are low. Rest of CBC WNL  Next Visit: 01/12/2023  Last Visit: 07/14/2022  UE:[id number] arthritis with rheumatoid factor of multiple sites without organ or systems involvement   Current Dose per office note 07/14/2022: Plaquenil 200 mg 1 tablet by mouth twice daily Monday through Friday   Okay to refill Plaquenil?

## 2022-10-22 LAB — COLOGUARD: COLOGUARD: NEGATIVE

## 2022-10-29 DIAGNOSIS — F4320 Adjustment disorder, unspecified: Secondary | ICD-10-CM | POA: Diagnosis not present

## 2022-11-04 DIAGNOSIS — A493 Mycoplasma infection, unspecified site: Secondary | ICD-10-CM | POA: Diagnosis not present

## 2022-11-04 DIAGNOSIS — E538 Deficiency of other specified B group vitamins: Secondary | ICD-10-CM | POA: Diagnosis not present

## 2022-11-04 DIAGNOSIS — R790 Abnormal level of blood mineral: Secondary | ICD-10-CM | POA: Diagnosis not present

## 2022-11-04 DIAGNOSIS — M0579 Rheumatoid arthritis with rheumatoid factor of multiple sites without organ or systems involvement: Secondary | ICD-10-CM | POA: Diagnosis not present

## 2022-11-04 DIAGNOSIS — E039 Hypothyroidism, unspecified: Secondary | ICD-10-CM | POA: Diagnosis not present

## 2022-11-04 DIAGNOSIS — R6882 Decreased libido: Secondary | ICD-10-CM | POA: Diagnosis not present

## 2022-11-04 DIAGNOSIS — N951 Menopausal and female climacteric states: Secondary | ICD-10-CM | POA: Diagnosis not present

## 2022-11-04 DIAGNOSIS — R7989 Other specified abnormal findings of blood chemistry: Secondary | ICD-10-CM | POA: Diagnosis not present

## 2022-11-04 DIAGNOSIS — E559 Vitamin D deficiency, unspecified: Secondary | ICD-10-CM | POA: Diagnosis not present

## 2022-11-04 DIAGNOSIS — R5383 Other fatigue: Secondary | ICD-10-CM | POA: Diagnosis not present

## 2022-11-05 DIAGNOSIS — F4320 Adjustment disorder, unspecified: Secondary | ICD-10-CM | POA: Diagnosis not present

## 2022-11-12 DIAGNOSIS — E063 Autoimmune thyroiditis: Secondary | ICD-10-CM | POA: Diagnosis not present

## 2022-11-12 DIAGNOSIS — F4320 Adjustment disorder, unspecified: Secondary | ICD-10-CM | POA: Diagnosis not present

## 2022-11-12 DIAGNOSIS — E559 Vitamin D deficiency, unspecified: Secondary | ICD-10-CM | POA: Diagnosis not present

## 2022-11-12 DIAGNOSIS — Z77120 Contact with and (suspected) exposure to mold (toxic): Secondary | ICD-10-CM | POA: Diagnosis not present

## 2022-11-12 DIAGNOSIS — A493 Mycoplasma infection, unspecified site: Secondary | ICD-10-CM | POA: Diagnosis not present

## 2022-11-12 DIAGNOSIS — M0579 Rheumatoid arthritis with rheumatoid factor of multiple sites without organ or systems involvement: Secondary | ICD-10-CM | POA: Diagnosis not present

## 2022-11-18 DIAGNOSIS — F4320 Adjustment disorder, unspecified: Secondary | ICD-10-CM | POA: Diagnosis not present

## 2022-11-23 DIAGNOSIS — F4320 Adjustment disorder, unspecified: Secondary | ICD-10-CM | POA: Diagnosis not present

## 2022-11-25 ENCOUNTER — Other Ambulatory Visit (HOSPITAL_COMMUNITY): Payer: Self-pay

## 2022-11-25 MED ORDER — LEVOTHYROXINE SODIUM 25 MCG PO TABS
25.0000 ug | ORAL_TABLET | Freq: Every morning | ORAL | 3 refills | Status: AC
  Filled 2022-11-25: qty 90, 90d supply, fill #0
  Filled 2023-08-04: qty 90, 90d supply, fill #1

## 2022-11-25 MED ORDER — PROGESTERONE MICRONIZED 100 MG PO CAPS
ORAL_CAPSULE | ORAL | 3 refills | Status: AC
  Filled 2022-11-25: qty 90, 90d supply, fill #0
  Filled 2023-03-09: qty 90, 90d supply, fill #1
  Filled 2023-07-07: qty 90, 90d supply, fill #2
  Filled 2023-10-21: qty 90, 90d supply, fill #3

## 2022-12-01 DIAGNOSIS — F4320 Adjustment disorder, unspecified: Secondary | ICD-10-CM | POA: Diagnosis not present

## 2022-12-04 DIAGNOSIS — E063 Autoimmune thyroiditis: Secondary | ICD-10-CM | POA: Diagnosis not present

## 2022-12-04 DIAGNOSIS — E559 Vitamin D deficiency, unspecified: Secondary | ICD-10-CM | POA: Diagnosis not present

## 2022-12-04 DIAGNOSIS — M0579 Rheumatoid arthritis with rheumatoid factor of multiple sites without organ or systems involvement: Secondary | ICD-10-CM | POA: Diagnosis not present

## 2022-12-04 DIAGNOSIS — Z77120 Contact with and (suspected) exposure to mold (toxic): Secondary | ICD-10-CM | POA: Diagnosis not present

## 2022-12-04 DIAGNOSIS — A493 Mycoplasma infection, unspecified site: Secondary | ICD-10-CM | POA: Diagnosis not present

## 2022-12-04 DIAGNOSIS — N951 Menopausal and female climacteric states: Secondary | ICD-10-CM | POA: Diagnosis not present

## 2022-12-07 ENCOUNTER — Ambulatory Visit (INDEPENDENT_AMBULATORY_CARE_PROVIDER_SITE_OTHER): Payer: Commercial Managed Care - PPO | Admitting: Neurology

## 2022-12-07 DIAGNOSIS — G4733 Obstructive sleep apnea (adult) (pediatric): Secondary | ICD-10-CM

## 2022-12-07 DIAGNOSIS — R0683 Snoring: Secondary | ICD-10-CM | POA: Diagnosis not present

## 2022-12-07 DIAGNOSIS — G4720 Circadian rhythm sleep disorder, unspecified type: Secondary | ICD-10-CM

## 2022-12-07 DIAGNOSIS — M069 Rheumatoid arthritis, unspecified: Secondary | ICD-10-CM

## 2022-12-07 DIAGNOSIS — G4719 Other hypersomnia: Secondary | ICD-10-CM

## 2022-12-07 DIAGNOSIS — G478 Other sleep disorders: Secondary | ICD-10-CM

## 2022-12-07 DIAGNOSIS — E663 Overweight: Secondary | ICD-10-CM

## 2022-12-07 DIAGNOSIS — R635 Abnormal weight gain: Secondary | ICD-10-CM

## 2022-12-07 DIAGNOSIS — Z820 Family history of epilepsy and other diseases of the nervous system: Secondary | ICD-10-CM

## 2022-12-07 DIAGNOSIS — G479 Sleep disorder, unspecified: Secondary | ICD-10-CM

## 2022-12-08 NOTE — Procedures (Signed)
Physician Interpretation:     [facility] POLYSOMNOGRAPHY  INTERPRETATION REPORT   STUDY DATE:  12/07/2022     PATIENT NAME:  Mariah Palmer         DATE OF BIRTH:  January 15, 1976  PATIENT ID:  [id number]    TYPE OF STUDY:  PSG  READING PHYSICIAN: Huston Foley, MD, PhD   SCORING TECHNICIAN: Domingo Cocking, RPSGT     Referred by: Charlton Amor, DO  ? History and Indication for Testing: 47 year old female with an underlying medical history of psoriasis, rheumatoid arthritis, thyroid disease, and borderline overweight state, who reports snoring and excessive daytime somnolence. Her Epworth sleepiness score is 10 out of 24, fatigue severity score is 42 out of 63. Height: 65 in Weight: 152 lb (BMI 25) Neck Size: 13 in    MEDICATIONS: Plaquenil, Synthroid, One A Day Womens vitaamin, Probiotic, Prometrium, NP Thyroid, Kenalog, Flonase   TECHNICAL DESCRIPTION: A registered sleep technologist was in attendance for the duration of the recording.  Data collection, scoring, video monitoring, and reporting were performed in compliance with the AASM Manual for the Scoring of Sleep and Associated Events; (Hypopnea is scored based on the criteria listed in Section VIII D. 1b in the AASM Manual V2.6 using a 4% oxygen desaturation rule or Hypopnea is scored based on the criteria listed in Section VIII D. 1a in the AASM Manual V2.6 using 3% oxygen desaturation and /or arousal rule).   SLEEP CONTINUITY AND SLEEP ARCHITECTURE:  Lights-out was at 22:01: and lights-on at  05:08:, with a total recording time of 7 hours, 7 min. Total sleep time ( TST) was 336.0 minutes with a mildly decreased sleep efficiency at 78.7%. There was  19.2% REM sleep.   BODY POSITION:  TST was divided  between the following sleep positions: 21.3% supine;  78.7% lateral;  0% prone. Duration of total sleep and percent of total sleep in their respective position is as follows: supine 71 minutes (21%), non-supine  265 minutes (79%); right 65 minutes (19%), left 199 minutes (59%), and prone 00 minutes (0%).  Total supine REM sleep time was 23 minutes (36% of total REM sleep).  Sleep latency was increased at 37.5 minutes.  REM sleep latency was increased at 143.0 minutes. Of the total sleep time, the percentage of stage N1 sleep was 8.3%, stage N2 sleep was 68%, which is increased, stage N3 sleep was 4.5%, which is reduced, and REM sleep was 19.2%, which is near-normal. Wake after sleep onset (WASO) time accounted for 53.5 minutes with minimal to mild sleep fragmentation noted.   RESPIRATORY MONITORING:  Based on CMS criteria (using a 4% oxygen desaturation rule for scoring hypopneas), there were 6 apneas (3 obstructive; 0 central; 3 mixed), and 20 hypopneas.  Apnea index was 1.1. Hypopnea index was 3.6. The apnea-hypopnea index was 4.6 overall (20.1 supine, 1 non-supine; 14.0 REM, 35.7 supine REM).  There were 0 respiratory effort-related arousals (RERAs).  The RERA index was 0 events/h. Total respiratory disturbance index (RDI) was 4.6 events/h. RDI results showed: supine RDI  20.1 /h; non-supine RDI 0.5 /h; REM RDI 14.0 /h, supine REM RDI 35.7 /h.   Based on AASM criteria (using a 3% oxygen desaturation and /or arousal rule for scoring hypopneas), there were 6 apneas (3 obstructive; 0 central; 3 mixed), and 34 hypopneas. Apnea index was 1.1. Hypopnea index was 6.1. The apnea-hypopnea index was 7.0/hour overall (26.0/hour supine, 12 non-supine; 22.3/hour REM, 40.9 supine REM).  There were  0 respiratory effort-related arousals (RERAs).  The RERA index was 0 events/h. Total respiratory disturbance index (RDI) was 7.1 events/h. RDI results showed: supine RDI  26.0 /h; non-supine RDI 2.0 /h; REM RDI 22.3 /h, supine REM RDI 40.9 /h.   OXIMETRY: Oxyhemoglobin Saturation Nadir during sleep was at 84% from a mean of 97%.  Of the Total sleep time (TST)   hypoxemia (=<88%) was present for 0.9 minutes, or 0.3% of total sleep  time.   LIMB MOVEMENTS: There were 0 periodic limb movements of sleep (0.0/hr), of which 0 (0.0/hr) were associated with an arousal.   AROUSAL: There were 63 arousals in total, for an arousal index of 11 arousals/hour.  Of these, 15 were identified as respiratory-related arousals (3 /h), 0 were PLM-related arousals (0 /h), and 56 were non-specific arousals (10 /h).   EEG: Review of the EEG showed no abnormal electrical discharges and symmetrical bihemispheric findings.    EKG: The EKG revealed normal sinus rhythm (NSR). The average heart rate during sleep was 68 bpm.  AUDIO/VIDEO REVIEW: The audio and video review did not show any abnormal or unusual behaviors, movements, phonations or vocalizations. The patient took no restroom breaks. Patient wore her occlusive guard for bruxism. No significant audible snoring was noted.   POST-STUDY QUESTIONNAIRE: Post study, the patient indicated, that sleep was the same as usual.   IMPRESSION:  1. Mild Obstructive Sleep Apnea (OSA) 2. Dysfunctions associated with sleep stages or arousal from sleep  RECOMMENDATIONS:  1. This study demonstrates overall mild obstructive sleep apnea, more pronounced during REM sleep and while in the supine position, with a total AHI of 7/hour, REM AHI of 22.3/hour, supine AHI of 26/hour and O2 nadir of 84% (during supine sleep). Given the patient's medical history and sleep related complaints, treatment with positive airway pressure is recommended; this can be achieved in the form of autoPAP. A full-night CPAP titration study would allow optimization of therapy if needed, down the road. Other treatment options may include avoidance of supine sleep position or the use of an oral appliance in selected patients. Please note, that untreated obstructive sleep apnea may carry additional perioperative morbidity. Patients with significant obstructive sleep apnea should receive perioperative PAP therapy and the surgeons and particularly  the anesthesiologist should be informed of the diagnosis and the severity of the sleep disordered breathing. The risks and ramifications of overall mild OSA are debatable.  2. This study shows some sleep fragmentation and mildly abnormal sleep stage percentages; these are nonspecific findings and per se do not signify an intrinsic sleep disorder or a cause for the patient's sleep-related symptoms. Causes include (but are not limited to) the first night effect of the sleep study, circadian rhythm disturbances, medication effect or an underlying mood disorder or medical problem.  3. The patient should be cautioned not to drive, work at heights, or operate dangerous or heavy equipment when tired or sleepy. Review and reiteration of good sleep hygiene measures should be pursued with any patient. 4. The patient will be seen in follow-up by Dr. Frances Furbish at [facility] for discussion of the test results and further management strategies. The referring provider will be notified of the test results.   I certify that I have reviewed the entire raw data recording prior to the issuance of this report in accordance with the Standards of Accreditation of the American Academy of Sleep Medicine (AASM). Huston Foley, MD, PhD Medical Director, [facility]) Diplomat, ABPN (Neurology and Sleep)  Technical Report:   General Information  Name: Jennelle, Jeanty BMI: 25.29 Physician: Huston Foley, MD  ID: [id number] Height: 65.0 in Technician: Domingo Cocking, RPSGT  Sex: Female Weight: 152.0 lb Record: [id number]  Age: 7 [Sep 09, 1975] Date: 12/07/2022    Medical & Medication History    Ms. Eid is a 47 year old female with an underlying medical history of psoriasis, rheumatoid arthritis, thyroid disease, and borderline overweight state, who reports snoring and excessive daytime somnolence. Her Epworth sleepiness score is 10 out of 24, fatigue severity score is 42 out of  63. I reviewed your office note from 08/03/2022. She sleep with 3 pillows, which helps reduce her snoring. She snores even on her sides, per husband's feedback. Both parents have OSA and use PAP machines. She lives with her husband and has 2 kids, one is a Holiday representative in college, the younger is a Printmaker in high school. She has had a little bit of weight gain in the past few years, essentially since COVID but overall stable since then. She had a tonsillectomy and adenoidectomy at age 82. She is a restless sleeper and does not wake up rested. She drinks caffeine in the form of coffee, 2 cups in the morning, alcohol infrequently, maybe 1 beer a week, she is a non-smoker. Bedtime is generally around 10 PM and rise time around 6:30 AM.  Plaquenil, Synthroid, One A Day Womens vitaamin, Probiotic, Prometrium, NP Thyroid, Kenalog, Flonase   Sleep Disorder      Comments   Patient arrived for a diagnostic polysomnogram. Procedure explained and all questions answered. Standard paste setup without complications. Patient wore her oral appliance for bruxism. Patient slept supine, left, and right. No significant snoring heard. Very few respiratory events observed, worse while in supine REM sleep. No significant cardiac arrhythmias observed. No significant PLMS observed. No nocturia.     Lights out: 10:01:14 PM Lights on: 05:08:30 AM   Time Total Supine Side Prone Upright  Recording (TRT) 7h 7.73m 1h 26.81m 5h 41.48m 0h 0.13m 0h 0.79m  Sleep (TST) 5h 36.56m 1h 11.35m 4h 24.49m 0h 0.62m 0h 0.23m   Latency N1 N2 N3 REM Onset Per. Slp. Eff.  Actual 0h 0.17m 0h 4.98m 1h 27.68m 2h 23.33m 0h 37.80m 0h 42.22m 78.69%   Stg Dur Wake N1 N2 N3 REM  Total 91.0 28.0 228.5 15.0 64.5  Supine 14.5 9.0 39.0 0.0 23.5  Side 76.5 19.0 189.5 15.0 41.0  Prone 0.0 0.0 0.0 0.0 0.0  Upright 0.0 0.0 0.0 0.0 0.0   Stg % Wake N1 N2 N3 REM  Total 21.3 8.3 68.0 4.5 19.2  Supine 3.4 2.7 11.6 0.0 7.0  Side 17.9 5.7 56.4 4.5 12.2  Prone 0.0 0.0 0.0 0.0 0.0   Upright 0.0 0.0 0.0 0.0 0.0     Apnea Summary Sub Supine Side Prone Upright  Total 6 Total 6 4 2  0 0    REM 4 3 1  0 0    NREM 2 1 1  0 0  Obs 3 REM 1 1 0 0 0    NREM 2 1 1  0 0  Mix 3 REM 3 2 1  0 0    NREM 0 0 0 0 0  Cen 0 REM 0 0 0 0 0    NREM 0 0 0 0 0   Rera Summary Sub Supine Side Prone Upright  Total 0 Total 0 0 0 0 0    REM 0 0 0 0 0    NREM 0 0 0  0 0   Hypopnea Summary Sub Supine Side Prone Upright  Total 33 Total 33 26 7 0 0    REM 19 12 7  0 0    NREM 14 14 0 0 0   4% Hypopnea Summary Sub Supine Side Prone Upright  Total (4%) 19 Total 19 19 0 0 0    REM 10 10 0 0 0    NREM 9 9 0 0 0     AHI Total Obs Mix Cen  6.96 Apnea 1.07 0.54 0.54 0.00   Hypopnea 5.89 -- -- --  4.46 Hypopnea (4%) 3.39 -- -- --    Total Supine Side Prone Upright  Position AHI 6.96 25.17 2.04 0.00 0.00  REM AHI 21.40   NREM AHI 3.54   Position RDI 6.96 25.17 2.04 0.00 0.00  REM RDI 21.40   NREM RDI 3.54    4% Hypopnea Total Supine Side Prone Upright  Position AHI (4%) 4.46 19.30 0.45 0.00 0.00  REM AHI (4%) 13.02   NREM AHI (4%) 2.43   Position RDI (4%) 4.46 19.30 0.45 0.00 0.00  REM RDI (4%) 13.02   NREM RDI (4%) 2.43    Desaturation Information Threshold: 2% <100% <90% <80% <70% <60% <50% <40%  Supine 64.0 6.0 0.0 0.0 0.0 0.0 0.0  Side 73.0 0.0 0.0 0.0 0.0 0.0 0.0  Prone 0.0 0.0 0.0 0.0 0.0 0.0 0.0  Upright 0.0 0.0 0.0 0.0 0.0 0.0 0.0  Total 137.0 6.0 0.0 0.0 0.0 0.0 0.0  Index 21.2 0.9 0.0 0.0 0.0 0.0 0.0   Threshold: 3% <100% <90% <80% <70% <60% <50% <40%  Supine 39.0 6.0 0.0 0.0 0.0 0.0 0.0  Side 24.0 0.0 0.0 0.0 0.0 0.0 0.0  Prone 0.0 0.0 0.0 0.0 0.0 0.0 0.0  Upright 0.0 0.0 0.0 0.0 0.0 0.0 0.0  Total 63.0 6.0 0.0 0.0 0.0 0.0 0.0  Index 9.7 0.9 0.0 0.0 0.0 0.0 0.0   Threshold: 4% <100% <90% <80% <70% <60% <50% <40%  Supine 25.0 6.0 0.0 0.0 0.0 0.0 0.0  Side 9.0 0.0 0.0 0.0 0.0 0.0 0.0  Prone 0.0 0.0 0.0 0.0 0.0 0.0 0.0  Upright 0.0 0.0 0.0 0.0 0.0 0.0 0.0  Total  34.0 6.0 0.0 0.0 0.0 0.0 0.0  Index 5.3 0.9 0.0 0.0 0.0 0.0 0.0   Threshold: 3% <100% <90% <80% <70% <60% <50% <40%  Supine 39 6 0 0 0 0 0  Side 24 0 0 0 0 0 0  Prone 0 0 0 0 0 0 0  Upright 0 0 0 0 0 0 0  Total 63 6 0 0 0 0 0   Awakening/Arousal Information # of Awakenings 20  Wake after sleep onset 53.46m  Wake after persistent sleep 53.33m   Arousal Assoc. Arousals Index  Apneas 1 0.2  Hypopneas 13 2.3  Leg Movements 3 0.5  Snore 0 0.0  PTT Arousals 0 0.0  Spontaneous 56 10.0  Total 73 13.0  Leg Movement Information PLMS LMs Index  Total LMs during PLMS 0 0.0  LMs w/ Microarousals 0 0.0   LM LMs Index  w/ Microarousal 3 0.5  w/ Awakening 0 0.0  w/ Resp Event 0 0.0  Spontaneous 5 0.9  Total 8 1.4     Desaturation threshold setting: 3% Minimum desaturation setting: 10 seconds SaO2 nadir: 84% The longest event was a 25 sec obstructive Hypopnea with a minimum SaO2 of 96%. The lowest SaO2 was 84% associated with a  17 sec obstructive Hypopnea. EKG Rates EKG Avg Max Min  Awake 77 109 60  Asleep 68 84 55  EKG Events: Tachycardia

## 2022-12-08 NOTE — Addendum Note (Signed)
Addended by: Huston Foley on: 12/08/2022 06:26 PM   Modules accepted: Orders

## 2022-12-10 DIAGNOSIS — F4320 Adjustment disorder, unspecified: Secondary | ICD-10-CM | POA: Diagnosis not present

## 2022-12-17 DIAGNOSIS — F4320 Adjustment disorder, unspecified: Secondary | ICD-10-CM | POA: Diagnosis not present

## 2022-12-22 NOTE — Telephone Encounter (Addendum)
Bobbye Morton, CMA  Kathyrn Sheriff; Duffy Rhody, Dellie Burns, Clovis Riley New orders have been placed for the above pt, DOB: 05/02/1975 Thanks  New, Maryruth Bun, Abbe Amsterdam, CMA; Kathyrn Sheriff; Duffy Rhody, Efraim Kaufmann; 1 other Received, Thank you!

## 2022-12-29 DIAGNOSIS — G4733 Obstructive sleep apnea (adult) (pediatric): Secondary | ICD-10-CM | POA: Diagnosis not present

## 2022-12-29 NOTE — Progress Notes (Signed)
Office Visit Note  Patient: Mariah Palmer             Date of Birth: 07-Aug-1975           MRN: [id number]             PCP: Charlton Amor, DO Referring: Christen Butter, NP Visit Date: 01/12/2023 Occupation: @GUAROCC @  Subjective:  Medication management  History of Present Illness: Lili Harts is a 47 y.o. female with seropositive rheumatoid arthritis and osteoarthritis.  She states that she has been doing well on hydroxychloroquine 200 mg p.o. twice daily Monday to Friday.  She had no interruption in the therapy.  She continues to notice some puffiness in her hands but they are not painful.  She had intermittent swelling in her left ankle joint.  She takes anti-inflammatories occasionally.  She has noticed some psoriasis lesions on her earlobes and elbows with the weather change.  She has been using topical triamcinolone cream as needed.  There is no history of eye inflammation, Achilles tendinitis or plantar fasciitis.    Activities of Daily Living:  Patient reports morning stiffness for 2-3 hours.   Patient Denies nocturnal pain.  Difficulty dressing/grooming: Denies Difficulty climbing stairs: Denies Difficulty getting out of chair: Denies Difficulty using hands for taps, buttons, cutlery, and/or writing: Denies  Review of Systems  Constitutional:  Negative for fatigue.  HENT:  Negative for mouth sores and mouth dryness.   Eyes:  Negative for dryness.  Respiratory:  Negative for shortness of breath.   Cardiovascular:  Negative for chest pain and palpitations.  Gastrointestinal:  Negative for blood in stool, constipation and diarrhea.  Endocrine: Negative for increased urination.  Genitourinary:  Negative for involuntary urination.  Musculoskeletal:  Positive for joint pain, joint pain, joint swelling and morning stiffness. Negative for gait problem, myalgias, muscle weakness, muscle tenderness and myalgias.  Skin:  Negative for color change, rash, hair loss and sensitivity to  sunlight.  Allergic/Immunologic: Negative for susceptible to infections.  Neurological:  Negative for dizziness and headaches.  Hematological:  Negative for swollen glands.  Psychiatric/Behavioral:  Negative for depressed mood and sleep disturbance. The patient is not nervous/anxious.     PMFS History:  Patient Active Problem List   Diagnosis Date Noted   Abnormal mammogram 10/05/2022   Nasal crusting 09/22/2022   Nasal turbinate hypertrophy 09/22/2022   Sleep apnea 08/03/2022   Irregular menses 05/03/2020   Snoring 05/03/2020   Rheumatoid arthritis involving multiple sites with positive rheumatoid factor (HCC) 09/13/2017   High risk medication use 09/13/2017   Primary osteoarthritis of both hands 09/13/2017   Primary osteoarthritis of both feet 09/13/2017   Rheumatoid factor positive 08/24/2017   Psoriasis 10/24/2016   Seasonal allergic rhinitis due to pollen 12/09/2015   Acquired hypothyroidism 02/03/2012   Alopecia areata 02/03/2012    Past Medical History:  Diagnosis Date   Psoriasis    Rheumatoid arthritis (HCC)    Sleep apnea    mild, on CPAP   Thyroid disease     Family History  Problem Relation Age of Onset   Sleep apnea Mother        cpap started in her 85s   Prostate cancer Father    Heart disease Father    Sleep apnea Father        on cpap since middle age   Skin cancer Father    Breast cancer Paternal Aunt    Past Surgical History:  Procedure Laterality Date  DILATION AND CURETTAGE OF UTERUS     TONSILLECTOMY AND ADENOIDECTOMY     Social History   Social History Narrative   Caffeine: 2 cups coffee per day   Immunization History  Administered Date(s) Administered   Influenza-Unspecified 02/03/2012, 01/12/2019, 01/12/2020, 01/09/2021, 12/30/2021   PFIZER(Purple Top)SARS-COV-2 Vaccination 04/24/2019, 05/16/2019   Tdap 02/03/2007, 05/03/2020     Objective: Vital Signs: BP 116/80 (BP Location: Left Arm, Patient Position: Sitting, Cuff Size:  Normal)   Pulse 91   Resp 14   Ht 5\' 5"  (1.651 m)   Wt 151 lb 6.4 oz (68.7 kg)   BMI 25.19 kg/m    Physical Exam Vitals and nursing note reviewed.  Constitutional:      Appearance: She is well-developed.  HENT:     Head: Normocephalic and atraumatic.  Eyes:     Conjunctiva/sclera: Conjunctivae normal.  Cardiovascular:     Rate and Rhythm: Normal rate and regular rhythm.     Heart sounds: Normal heart sounds.  Pulmonary:     Effort: Pulmonary effort is normal.     Breath sounds: Normal breath sounds.  Abdominal:     General: Bowel sounds are normal.     Palpations: Abdomen is soft.  Musculoskeletal:     Cervical back: Normal range of motion.  Lymphadenopathy:     Cervical: No cervical adenopathy.  Skin:    General: Skin is warm and dry.     Capillary Refill: Capillary refill takes less than 2 seconds.  Neurological:     Mental Status: She is alert and oriented to person, place, and time.  Psychiatric:        Behavior: Behavior normal.      Musculoskeletal Exam: Cervical, thoracic and lumbar spine were in good range of motion.  Shoulder joints, elbow joints, wrist joints, MCPs PIPs and DIPs were in good range of motion.  Thickening of the right wrist joint and bilateral third MCP joints was noted.  Hip joints and knee joints in good range of motion.  There was no tenderness or swelling over ankle joints.  There was no tenderness over MTP joints.  Thickening of bilateral first MTP joints was noted.  CDAI Exam: CDAI Score: 5.1  Patient Global: 1 / 100; Provider Global: 20 / 100 Swollen: 3 ; Tender: 0  Joint Exam 01/12/2023      Right  Left  Wrist  Swollen      MCP 3  Swollen   Swollen      Investigation: No additional findings.  Imaging: No results found.  Recent Labs: Lab Results  Component Value Date   WBC 5.6 07/14/2022   HGB 12.9 07/14/2022   PLT 266 07/14/2022   NA 139 07/14/2022   K 4.6 07/14/2022   CL 105 07/14/2022   CO2 28 07/14/2022   GLUCOSE  97 07/14/2022   BUN 8 07/14/2022   CREATININE 0.77 07/14/2022   BILITOT 0.4 07/14/2022   ALKPHOS 61 02/26/2020   AST 13 07/14/2022   ALT 6 07/14/2022   PROT 7.2 07/14/2022   ALBUMIN 4.6 02/26/2020   CALCIUM 9.3 07/14/2022   GFRAA 109 05/16/2020   QFTBGOLDPLUS NEGATIVE 08/10/2019    Speciality Comments: PLQ eye exam 01/22/2022  WNL  [facility], f/u 1 year   Procedures:  No procedures performed Allergies: Patient has no known allergies.   Assessment / Plan:     Visit Diagnoses: Rheumatoid arthritis with rheumatoid factor of multiple sites without organ or systems involvement (HCC) - +  RF, +CCP Ab, erosive disease: -Patient denies having any joint swelling.  Thickening of the right wrist joint, bilateral second and third MCP joints was noted.  No synovitis was noted.  Patient gives history of intermittent swelling in her hands and her left ankle.  She does not want to be on more aggressive therapy.  She has been taking Plaquenil 200 mg p.o. twice daily Monday to Friday since September 2021.  Plan: XR Foot 2 Views Right, XR Foot 2 Views Left, XR Hand 2 View Right, XR Hand 2 View Left, x-ray findings were suggestive of rheumatoid arthritis and osteoarthritis overlap.  Erosive changes were noted as described in the report.  No radiographic progression was noted when compared to the x-rays of 2022.  Although there were new erosive changes when compared to the x-rays of 2019.  X-ray findings were reviewed with the patient at length.  Patient is not interested in changing immunosuppressive therapy.  Hydroxychloroquine (PLAQUENIL) 200 MG tablet  High risk medication use - Plaquenil 200 mg 1 tablet by mouth twice daily Monday through Friday.  Eye exam January 22, 2022.  Patient states her eye examination is scheduled for November 2024.  Labs obtained on July 12, 2022 were stable.  Sed rate was normal in December 2022.- Plan: CBC with Differential/Platelet, COMPLETE METABOLIC PANEL WITH  GFR.  Information on immunization was placed in the AVS.  Primary osteoarthritis of both hands-she has rheumatoid arthritis and osteoarthritis overlap with MCP and wrist joint thickening.  Primary osteoarthritis of both feet-thickening of bilateral first MTP joints was noted.  Patient gives history of intermittent swelling in her left ankle joint.  No synovitis was noted on the examination.  Psoriasis -psoriasis patches were noted over both elbows and in the ear canals.  Patient uses triamcinolone cream as needed.  Acquired hypothyroidism  Hyperpigmentation  OSA (obstructive sleep apnea) -she was recently diagnosed with obstructive sleep apnea.  Patient also had evaluation by ENT.  She is currently using CPAP.  Orders: Orders Placed This Encounter  Procedures   XR Foot 2 Views Right   XR Foot 2 Views Left   XR Hand 2 View Right   XR Hand 2 View Left   CBC with Differential/Platelet   COMPLETE METABOLIC PANEL WITH GFR   Meds ordered this encounter  Medications   hydroxychloroquine (PLAQUENIL) 200 MG tablet    Sig: Take 1 tablet (200 mg total) by mouth 2 (two) times daily Monday through Friday.    Dispense:  120 tablet    Refill:  0     Follow-Up Instructions: Return in about 5 months (around 06/12/2023) for Rheumatoid arthritis.   Pollyann Savoy, MD  Note - This record has been created using Animal nutritionist.  Chart creation errors have been sought, but may not always  have been located. Such creation errors do not reflect on  the standard of medical care.

## 2022-12-31 DIAGNOSIS — F4320 Adjustment disorder, unspecified: Secondary | ICD-10-CM | POA: Diagnosis not present

## 2023-01-12 ENCOUNTER — Ambulatory Visit: Payer: Commercial Managed Care - PPO

## 2023-01-12 ENCOUNTER — Encounter: Payer: Self-pay | Admitting: Rheumatology

## 2023-01-12 ENCOUNTER — Other Ambulatory Visit (HOSPITAL_COMMUNITY): Payer: Self-pay

## 2023-01-12 ENCOUNTER — Other Ambulatory Visit: Payer: Self-pay

## 2023-01-12 ENCOUNTER — Ambulatory Visit: Payer: Commercial Managed Care - PPO | Attending: Rheumatology | Admitting: Rheumatology

## 2023-01-12 VITALS — BP 116/80 | HR 91 | Resp 14 | Ht 65.0 in | Wt 151.4 lb

## 2023-01-12 DIAGNOSIS — M19041 Primary osteoarthritis, right hand: Secondary | ICD-10-CM

## 2023-01-12 DIAGNOSIS — M79672 Pain in left foot: Secondary | ICD-10-CM

## 2023-01-12 DIAGNOSIS — Z79899 Other long term (current) drug therapy: Secondary | ICD-10-CM

## 2023-01-12 DIAGNOSIS — M19042 Primary osteoarthritis, left hand: Secondary | ICD-10-CM

## 2023-01-12 DIAGNOSIS — M19072 Primary osteoarthritis, left ankle and foot: Secondary | ICD-10-CM | POA: Diagnosis not present

## 2023-01-12 DIAGNOSIS — E039 Hypothyroidism, unspecified: Secondary | ICD-10-CM

## 2023-01-12 DIAGNOSIS — M0579 Rheumatoid arthritis with rheumatoid factor of multiple sites without organ or systems involvement: Secondary | ICD-10-CM

## 2023-01-12 DIAGNOSIS — M79642 Pain in left hand: Secondary | ICD-10-CM

## 2023-01-12 DIAGNOSIS — M79641 Pain in right hand: Secondary | ICD-10-CM

## 2023-01-12 DIAGNOSIS — M79671 Pain in right foot: Secondary | ICD-10-CM

## 2023-01-12 DIAGNOSIS — L819 Disorder of pigmentation, unspecified: Secondary | ICD-10-CM

## 2023-01-12 DIAGNOSIS — G4733 Obstructive sleep apnea (adult) (pediatric): Secondary | ICD-10-CM

## 2023-01-12 DIAGNOSIS — L409 Psoriasis, unspecified: Secondary | ICD-10-CM | POA: Diagnosis not present

## 2023-01-12 DIAGNOSIS — M19071 Primary osteoarthritis, right ankle and foot: Secondary | ICD-10-CM

## 2023-01-12 MED ORDER — HYDROXYCHLOROQUINE SULFATE 200 MG PO TABS
200.0000 mg | ORAL_TABLET | Freq: Two times a day (BID) | ORAL | 0 refills | Status: DC
Start: 2023-01-12 — End: 2023-04-12
  Filled 2023-01-12: qty 120, 84d supply, fill #0

## 2023-01-12 NOTE — Patient Instructions (Signed)

## 2023-01-13 LAB — CBC WITH DIFFERENTIAL/PLATELET
Absolute Lymphocytes: 1359 {cells}/uL (ref 850–3900)
Absolute Monocytes: 384 {cells}/uL (ref 200–950)
Basophils Absolute: 72 {cells}/uL (ref 0–200)
Basophils Relative: 1.1 %
Eosinophils Absolute: 13 {cells}/uL — ABNORMAL LOW (ref 15–500)
Eosinophils Relative: 0.2 %
HCT: 40.9 % (ref 35.0–45.0)
Hemoglobin: 13 g/dL (ref 11.7–15.5)
MCH: 27.7 pg (ref 27.0–33.0)
MCHC: 31.8 g/dL — ABNORMAL LOW (ref 32.0–36.0)
MCV: 87.2 fL (ref 80.0–100.0)
MPV: 9.4 fL (ref 7.5–12.5)
Monocytes Relative: 5.9 %
Neutro Abs: 4674 {cells}/uL (ref 1500–7800)
Neutrophils Relative %: 71.9 %
Platelets: 292 10*3/uL (ref 140–400)
RBC: 4.69 10*6/uL (ref 3.80–5.10)
RDW: 12.8 % (ref 11.0–15.0)
Total Lymphocyte: 20.9 %
WBC: 6.5 10*3/uL (ref 3.8–10.8)

## 2023-01-13 LAB — COMPLETE METABOLIC PANEL WITH GFR
AG Ratio: 1.5 (calc) (ref 1.0–2.5)
ALT: 15 U/L (ref 6–29)
AST: 16 U/L (ref 10–35)
Albumin: 4.4 g/dL (ref 3.6–5.1)
Alkaline phosphatase (APISO): 46 U/L (ref 31–125)
BUN: 12 mg/dL (ref 7–25)
CO2: 28 mmol/L (ref 20–32)
Calcium: 9.7 mg/dL (ref 8.6–10.2)
Chloride: 104 mmol/L (ref 98–110)
Creat: 0.72 mg/dL (ref 0.50–0.99)
Globulin: 3 g/dL (ref 1.9–3.7)
Glucose, Bld: 93 mg/dL (ref 65–99)
Potassium: 4.2 mmol/L (ref 3.5–5.3)
Sodium: 140 mmol/L (ref 135–146)
Total Bilirubin: 0.4 mg/dL (ref 0.2–1.2)
Total Protein: 7.4 g/dL (ref 6.1–8.1)
eGFR: 105 mL/min/{1.73_m2} (ref >=60)

## 2023-01-13 NOTE — Progress Notes (Signed)
CBC and CMP are normal.

## 2023-01-15 DIAGNOSIS — F4320 Adjustment disorder, unspecified: Secondary | ICD-10-CM | POA: Diagnosis not present

## 2023-01-21 ENCOUNTER — Other Ambulatory Visit: Payer: Self-pay

## 2023-01-22 DIAGNOSIS — F4320 Adjustment disorder, unspecified: Secondary | ICD-10-CM | POA: Diagnosis not present

## 2023-01-28 DIAGNOSIS — F4320 Adjustment disorder, unspecified: Secondary | ICD-10-CM | POA: Diagnosis not present

## 2023-01-29 DIAGNOSIS — G4733 Obstructive sleep apnea (adult) (pediatric): Secondary | ICD-10-CM | POA: Diagnosis not present

## 2023-02-01 NOTE — Progress Notes (Deleted)
PATIENT: Mariah Palmer DOB: 07/31/1975  REASON FOR VISIT: follow up HISTORY FROM: patient PRIMARY NEUROLOGIST:   HISTORY OF PRESENT ILLNESS: Today 02/03/23:  Mariah Palmer is a 47 y.o. female with a history of ***. Returns today for follow-up.      HISTORY   REVIEW OF SYSTEMS: Out of a complete 14 system review of symptoms, the patient complains only of the following symptoms, and all other reviewed systems are negative.  FSS ESS  ALLERGIES: No Known Allergies  HOME MEDICATIONS: Outpatient Medications Prior to Visit  Medication Sig Dispense Refill   fluticasone (FLONASE) 50 MCG/ACT nasal spray Place 2 sprays into both nostrils daily as needed (Patient not taking: Reported on 01/12/2023) 16 g 3   hydroxychloroquine (PLAQUENIL) 200 MG tablet Take 1 tablet (200 mg total) by mouth 2 (two) times daily Monday through Friday. 120 tablet 0   levothyroxine (SYNTHROID) 25 MCG tablet Take 1 tablet (25 mcg total) by mouth every morning on an empty stomach, may eat in 30 to 60 minutes. 90 tablet 3   Multiple Vitamins-Calcium (ONE-A-DAY WOMENS PO) Take by mouth.     mupirocin ointment (BACTROBAN) 2 % Apply within both nasal passages twice daily for two weeks. (Patient not taking: Reported on 01/12/2023) 22 g 0   Probiotic Product (PROBIOTIC PO) Take by mouth daily.     progesterone (PROMETRIUM) 100 MG capsule TAKE 1 CAPSULE BY MOUTH NIGHTLY ON DAYS 5-28 OF CYCLE 90 capsule 3   thyroid (NP THYROID) 90 MG tablet Take 1 tablet by mouth in the morning 20-30 mins prior to breakfast. 90 tablet 3   triamcinolone ointment (KENALOG) 0.1 % Apply to areas of rash twice daily for 2 - 4 weeks-- avoid use on face and skin folds 454 g 0   No facility-administered medications prior to visit.    PAST MEDICAL HISTORY: Past Medical History:  Diagnosis Date   Psoriasis    Rheumatoid arthritis (HCC)    Sleep apnea    mild, on CPAP   Thyroid disease     PAST SURGICAL HISTORY: Past Surgical  History:  Procedure Laterality Date   DILATION AND CURETTAGE OF UTERUS     TONSILLECTOMY AND ADENOIDECTOMY      FAMILY HISTORY: Family History  Problem Relation Age of Onset   Sleep apnea Mother        cpap started in her 56s   Prostate cancer Father    Heart disease Father    Sleep apnea Father        on cpap since middle age   Skin cancer Father    Breast cancer Paternal Aunt     SOCIAL HISTORY: Social History   Socioeconomic History   Marital status: Married    Spouse name: Not on file   Number of children: Not on file   Years of education: Not on file   Highest education level: Not on file  Occupational History   Not on file  Tobacco Use   Smoking status: Never    Passive exposure: Never   Smokeless tobacco: Never  Vaping Use   Vaping status: Never Used  Substance and Sexual Activity   Alcohol use: Yes    Alcohol/week: 1.0 standard drink of alcohol    Types: 1 Cans of beer per week    Comment: occasionally   Drug use: Never   Sexual activity: Yes    Partners: Male    Birth control/protection: Condom  Other Topics Concern   Not  on file  Social History Narrative   Caffeine: 2 cups coffee per day   Social Determinants of Health   Financial Resource Strain: Not on file  Food Insecurity: No Food Insecurity (10/30/2020)   Received from [facility], [facility]   Hunger Vital Sign    Worried About Running Out of Food in the Last Year: Never true    Ran Out of Food in the Last Year: Never true  Transportation Needs: Not on file  Physical Activity: Not on file  Stress: Not on file  Social Connections: Unknown (07/28/2021)   Received from [facility], [facility]   Social Network    Social Network: Not on file  Intimate Partner Violence: Unknown (06/30/2021)   Received from [facility], [facility]   HITS    Physically Hurt: Not on file    Insult or Talk Down To: Not on file    Threaten Physical Harm: Not on file    Scream or Curse: Not on file       PHYSICAL EXAM  There were no vitals filed for this visit. There is no height or weight on file to calculate BMI.  Generalized: Well developed, in no acute distress  Chest: Lungs clear to auscultation bilaterally  Neurological examination  Mentation: Alert oriented to time, place, history taking. Follows all commands speech and language fluent Cranial nerve II-XII: Extraocular movements were full, visual field were full on confrontational test Head turning and shoulder shrug  were normal and symmetric. Motor: The motor testing reveals 5 over 5 strength of all 4 extremities. Good symmetric motor tone is noted throughout.  Sensory: Sensory testing is intact to soft touch on all 4 extremities. No evidence of extinction is noted.  Gait and station: Gait is normal.    DIAGNOSTIC DATA (LABS, IMAGING, TESTING) - I reviewed patient records, labs, notes, testing and imaging myself where available.  Lab Results  Component Value Date   WBC 6.5 01/12/2023   HGB 13.0 01/12/2023   HCT 40.9 01/12/2023   MCV 87.2 01/12/2023   PLT 292 01/12/2023      Component Value Date/Time   NA 140 01/12/2023 0817   NA 139 02/26/2020 1446   K 4.2 01/12/2023 0817   CL 104 01/12/2023 0817   CO2 28 01/12/2023 0817   GLUCOSE 93 01/12/2023 0817   BUN 12 01/12/2023 0817   BUN 12 02/26/2020 1446   CREATININE 0.72 01/12/2023 0817   CALCIUM 9.7 01/12/2023 0817   PROT 7.4 01/12/2023 0817   PROT 8.1 02/26/2020 1446   ALBUMIN 4.6 02/26/2020 1446   AST 16 01/12/2023 0817   ALT 15 01/12/2023 0817   ALKPHOS 61 02/26/2020 1446   BILITOT 0.4 01/12/2023 0817   BILITOT 0.3 02/26/2020 1446   GFRNONAA 94 05/16/2020 0831   GFRAA 109 05/16/2020 0831   Lab Results  Component Value Date   CHOL 167 10/05/2022   HDL 60 10/05/2022   LDLCALC 89 10/05/2022   TRIG 88 10/05/2022   CHOLHDL 2.8 10/05/2022   No results found for: "HGBA1C" No results found for: "VITAMINB12" Lab Results  Component Value Date    TSH 0.23 (L) 07/25/2021      ASSESSMENT AND PLAN 47 y.o. year old female  has a past medical history of Psoriasis, Rheumatoid arthritis (HCC), Sleep apnea, and Thyroid disease. here with:  OSA on CPAP  - CPAP compliance excellent - Good treatment of AHI  - Encourage patient to use CPAP nightly and > 4 hours  each night - F/U in 1 year or sooner if needed   I spent *** minutes of face-to-face and non-face-to-face time with patient.  This included previsit chart review, lab review, study review, order entry, electronic health record documentation, patient education.  Butch Penny, MSN, NP-C 02/03/2023, 8:49 AM [facility] [address] [place], [place] [address] [phone]

## 2023-02-03 ENCOUNTER — Ambulatory Visit: Payer: Commercial Managed Care - PPO | Admitting: Adult Health

## 2023-02-03 ENCOUNTER — Encounter: Payer: Self-pay | Admitting: Neurology

## 2023-02-04 ENCOUNTER — Ambulatory Visit: Payer: Commercial Managed Care - PPO | Admitting: Adult Health

## 2023-02-04 ENCOUNTER — Encounter: Payer: Self-pay | Admitting: Adult Health

## 2023-02-04 ENCOUNTER — Encounter: Payer: Self-pay | Admitting: Rheumatology

## 2023-02-04 VITALS — BP 118/75 | HR 100 | Ht 65.0 in | Wt 153.0 lb

## 2023-02-04 DIAGNOSIS — G4733 Obstructive sleep apnea (adult) (pediatric): Secondary | ICD-10-CM | POA: Diagnosis not present

## 2023-02-04 DIAGNOSIS — H5201 Hypermetropia, right eye: Secondary | ICD-10-CM | POA: Diagnosis not present

## 2023-02-04 NOTE — Progress Notes (Signed)
PATIENT: Mariah Palmer DOB: 11/05/1975  REASON FOR VISIT: OSA on CPAP HISTORY FROM: patient PRIMARY NEUROLOGIST: Dr. Frances Furbish    Brief HPI:  Mariah Palmer is a 47 y.o. female who was evaluated by Dr. Frances Furbish on 09/08/2018 for for concern of underlying sleep apnea with reports of snoring and excessive daytime somnolence.  ESS 10/24. FSS 42/63.  Sleep study 12/07/2022 showed overall mild OSA more pronounced during REM sleep in supine position with total AHI of 7/h, REM AHI 22.3/h and supine AHI 26/h with O2 nadir of 84% (during supine sleep).  AutoPap set up date 12/29/2022.   Interval history:  CPAP compliance report shows excellent usage and optimal residual AHI.  Tolerating CPAP well, use of nasal mask, intolerant to nasal pillow.  Has noticed improvement of daytime energy levels and sleep quality. ESS 5/24 (prior 10/24). Followed by DME [facility].       REVIEW OF SYSTEMS: Out of a complete 14 system review of symptoms, the patient complains only of the following symptoms, and all other reviewed systems are negative.   ALLERGIES: No Known Allergies  HOME MEDICATIONS: Outpatient Medications Prior to Visit  Medication Sig Dispense Refill   fluticasone (FLONASE) 50 MCG/ACT nasal spray Place 2 sprays into both nostrils daily as needed 16 g 3   hydroxychloroquine (PLAQUENIL) 200 MG tablet Take 1 tablet (200 mg total) by mouth 2 (two) times daily Monday through Friday. 120 tablet 0   levothyroxine (SYNTHROID) 25 MCG tablet Take 1 tablet (25 mcg total) by mouth every morning on an empty stomach, may eat in 30 to 60 minutes. 90 tablet 3   Multiple Vitamins-Calcium (ONE-A-DAY WOMENS PO) Take by mouth.     mupirocin ointment (BACTROBAN) 2 % Apply within both nasal passages twice daily for two weeks. 22 g 0   Probiotic Product (PROBIOTIC PO) Take by mouth daily.     progesterone (PROMETRIUM) 100 MG capsule TAKE 1 CAPSULE BY MOUTH NIGHTLY ON DAYS 5-28 OF CYCLE 90 capsule 3   thyroid (NP  THYROID) 90 MG tablet Take 1 tablet by mouth in the morning 20-30 mins prior to breakfast. 90 tablet 3   triamcinolone ointment (KENALOG) 0.1 % Apply to areas of rash twice daily for 2 - 4 weeks-- avoid use on face and skin folds 454 g 0   No facility-administered medications prior to visit.    PAST MEDICAL HISTORY: Past Medical History:  Diagnosis Date   Psoriasis    Rheumatoid arthritis (HCC)    Sleep apnea    mild, on CPAP   Thyroid disease     PAST SURGICAL HISTORY: Past Surgical History:  Procedure Laterality Date   DILATION AND CURETTAGE OF UTERUS     TONSILLECTOMY AND ADENOIDECTOMY      FAMILY HISTORY: Family History  Problem Relation Age of Onset   Sleep apnea Mother        cpap started in her 34s   Prostate cancer Father    Heart disease Father    Sleep apnea Father        on cpap since middle age   Skin cancer Father    Breast cancer Paternal Aunt     SOCIAL HISTORY: Social History   Socioeconomic History   Marital status: Married    Spouse name: Not on file   Number of children: Not on file   Years of education: Not on file   Highest education level: Not on file  Occupational History   Not  on file  Tobacco Use   Smoking status: Never    Passive exposure: Never   Smokeless tobacco: Never  Vaping Use   Vaping status: Never Used  Substance and Sexual Activity   Alcohol use: Yes    Alcohol/week: 1.0 standard drink of alcohol    Types: 1 Cans of beer per week    Comment: occasionally   Drug use: Never   Sexual activity: Yes    Partners: Male    Birth control/protection: Condom  Other Topics Concern   Not on file  Social History Narrative   Caffeine: 2 cups coffee per day   Social Determinants of Health   Financial Resource Strain: Not on file  Food Insecurity: No Food Insecurity (10/30/2020)   Received from [facility], [facility]   Hunger Vital Sign    Worried About Running Out of Food in the Last Year: Never true    Ran Out of Food  in the Last Year: Never true  Transportation Needs: Not on file  Physical Activity: Not on file  Stress: Not on file  Social Connections: Unknown (07/28/2021)   Received from [facility], [facility]   Social Network    Social Network: Not on file  Intimate Partner Violence: Unknown (06/30/2021)   Received from [facility], [facility]   HITS    Physically Hurt: Not on file    Insult or Talk Down To: Not on file    Threaten Physical Harm: Not on file    Scream or Curse: Not on file      PHYSICAL EXAM  Vitals:   02/04/23 1424  BP: 118/75  Pulse: 100  Weight: 153 lb (69.4 kg)  Height: 5\' 5"  (1.651 m)   Body mass index is 25.46 kg/m.  Generalized: Well developed, in no acute distress  Chest: Lungs clear to auscultation bilaterally  Neurological examination  Mentation: Alert oriented to time, place, history taking. Follows all commands speech and language fluent Cranial nerve II-XII: Extraocular movements were full, visual field were full on confrontational test Head turning and shoulder shrug  were normal and symmetric. Motor: The motor testing reveals 5 over 5 strength of all 4 extremities. Good symmetric motor tone is noted throughout.  Sensory: Sensory testing is intact to soft touch on all 4 extremities. No evidence of extinction is noted.  Gait and station: Gait is normal.    DIAGNOSTIC DATA (LABS, IMAGING, TESTING) - I reviewed patient records, labs, notes, testing and imaging myself where available.  Lab Results  Component Value Date   WBC 6.5 01/12/2023   HGB 13.0 01/12/2023   HCT 40.9 01/12/2023   MCV 87.2 01/12/2023   PLT 292 01/12/2023      Component Value Date/Time   NA 140 01/12/2023 0817   NA 139 02/26/2020 1446   K 4.2 01/12/2023 0817   CL 104 01/12/2023 0817   CO2 28 01/12/2023 0817   GLUCOSE 93 01/12/2023 0817   BUN 12 01/12/2023 0817   BUN 12 02/26/2020 1446   CREATININE 0.72 01/12/2023 0817   CALCIUM 9.7 01/12/2023 0817   PROT 7.4  01/12/2023 0817   PROT 8.1 02/26/2020 1446   ALBUMIN 4.6 02/26/2020 1446   AST 16 01/12/2023 0817   ALT 15 01/12/2023 0817   ALKPHOS 61 02/26/2020 1446   BILITOT 0.4 01/12/2023 0817   BILITOT 0.3 02/26/2020 1446   GFRNONAA 94 05/16/2020 0831   GFRAA 109 05/16/2020 0831   Lab Results  Component Value Date  CHOL 167 10/05/2022   HDL 60 10/05/2022   LDLCALC 89 10/05/2022   TRIG 88 10/05/2022   CHOLHDL 2.8 10/05/2022   No results found for: "HGBA1C" No results found for: "VITAMINB12" Lab Results  Component Value Date   TSH 0.23 (L) 07/25/2021      ASSESSMENT AND PLAN 47 y.o. year old female  has a past medical history of Psoriasis, Rheumatoid arthritis (HCC), Sleep apnea, and Thyroid disease. here with:  OSA on CPAP  - CPAP compliance excellent - Good treatment of AHI  - Encourage patient to use CPAP nightly and > 4 hours each night - F/U in 1 year or sooner if needed   I spent 25 minutes of face-to-face and non-face-to-face time with patient.  This included previsit chart review, lab review, study review, order entry, electronic health record documentation, patient education and discussion regarding above diagnoses and treatment plan and answered all other questions to patient's satisfaction  Ihor Austin, [facility]  [facility] [address] [place], [place] [address]  Phone [phone] Fax [phone] Note: This document was prepared with digital dictation and possible smart phrase technology. Any transcriptional errors that result from this process are unintentional.

## 2023-02-04 NOTE — Patient Instructions (Addendum)
Your Plan:  Continue nightly use of CPAP for sleep apnea management  Continue to follow with DME company for any needed supplies or CPAP related concerns      Follow up in 1 year or call earlier if needed     Thank you for coming to see [place] at [facility]. I hope we have been able to provide you high quality care today.  You may receive a patient satisfaction survey over the next few weeks. We would appreciate your feedback and comments so that we may continue to improve ourselves and the health of our patients.

## 2023-02-04 NOTE — Telephone Encounter (Signed)
Form faxed

## 2023-02-11 DIAGNOSIS — F4320 Adjustment disorder, unspecified: Secondary | ICD-10-CM | POA: Diagnosis not present

## 2023-02-18 DIAGNOSIS — F4320 Adjustment disorder, unspecified: Secondary | ICD-10-CM | POA: Diagnosis not present

## 2023-02-28 DIAGNOSIS — G4733 Obstructive sleep apnea (adult) (pediatric): Secondary | ICD-10-CM | POA: Diagnosis not present

## 2023-03-03 DIAGNOSIS — F4320 Adjustment disorder, unspecified: Secondary | ICD-10-CM | POA: Diagnosis not present

## 2023-03-09 ENCOUNTER — Other Ambulatory Visit: Payer: Self-pay

## 2023-03-11 DIAGNOSIS — F4320 Adjustment disorder, unspecified: Secondary | ICD-10-CM | POA: Diagnosis not present

## 2023-03-18 DIAGNOSIS — D2272 Melanocytic nevi of left lower limb, including hip: Secondary | ICD-10-CM | POA: Diagnosis not present

## 2023-03-18 DIAGNOSIS — L219 Seborrheic dermatitis, unspecified: Secondary | ICD-10-CM | POA: Diagnosis not present

## 2023-03-18 DIAGNOSIS — L814 Other melanin hyperpigmentation: Secondary | ICD-10-CM | POA: Diagnosis not present

## 2023-03-18 DIAGNOSIS — D2261 Melanocytic nevi of right upper limb, including shoulder: Secondary | ICD-10-CM | POA: Diagnosis not present

## 2023-03-18 DIAGNOSIS — D2271 Melanocytic nevi of right lower limb, including hip: Secondary | ICD-10-CM | POA: Diagnosis not present

## 2023-03-18 DIAGNOSIS — D2262 Melanocytic nevi of left upper limb, including shoulder: Secondary | ICD-10-CM | POA: Diagnosis not present

## 2023-03-18 DIAGNOSIS — D225 Melanocytic nevi of trunk: Secondary | ICD-10-CM | POA: Diagnosis not present

## 2023-03-18 DIAGNOSIS — F4320 Adjustment disorder, unspecified: Secondary | ICD-10-CM | POA: Diagnosis not present

## 2023-03-31 DIAGNOSIS — G4733 Obstructive sleep apnea (adult) (pediatric): Secondary | ICD-10-CM | POA: Diagnosis not present

## 2023-04-02 DIAGNOSIS — G4733 Obstructive sleep apnea (adult) (pediatric): Secondary | ICD-10-CM | POA: Diagnosis not present

## 2023-04-12 ENCOUNTER — Other Ambulatory Visit: Payer: Self-pay | Admitting: Rheumatology

## 2023-04-12 ENCOUNTER — Other Ambulatory Visit (HOSPITAL_COMMUNITY): Payer: Self-pay

## 2023-04-12 DIAGNOSIS — M0579 Rheumatoid arthritis with rheumatoid factor of multiple sites without organ or systems involvement: Secondary | ICD-10-CM

## 2023-04-12 MED ORDER — HYDROXYCHLOROQUINE SULFATE 200 MG PO TABS
200.0000 mg | ORAL_TABLET | Freq: Two times a day (BID) | ORAL | 0 refills | Status: DC
Start: 2023-04-12 — End: 2023-06-18
  Filled 2023-04-12: qty 120, 84d supply, fill #0

## 2023-04-12 NOTE — Telephone Encounter (Signed)
 Last Fill: 01/12/2023  Eye exam: 02/04/2023  WNL     Labs: 01/12/2023 CBC and CMP are normal.   Next Visit: 06/15/2023  Last Visit: 01/12/2023  DX: Rheumatoid arthritis with rheumatoid factor of multiple sites without organ or systems involvement   Current Dose per office note 01/12/2023: Plaquenil  200 mg 1 tablet by mouth twice daily Monday through Friday.   Okay to refill Plaquenil ?

## 2023-04-13 ENCOUNTER — Other Ambulatory Visit (HOSPITAL_COMMUNITY): Payer: Self-pay

## 2023-04-15 DIAGNOSIS — F4320 Adjustment disorder, unspecified: Secondary | ICD-10-CM | POA: Diagnosis not present

## 2023-04-22 DIAGNOSIS — F4320 Adjustment disorder, unspecified: Secondary | ICD-10-CM | POA: Diagnosis not present

## 2023-04-29 DIAGNOSIS — F4320 Adjustment disorder, unspecified: Secondary | ICD-10-CM | POA: Diagnosis not present

## 2023-05-01 DIAGNOSIS — G4733 Obstructive sleep apnea (adult) (pediatric): Secondary | ICD-10-CM | POA: Diagnosis not present

## 2023-05-06 DIAGNOSIS — F4320 Adjustment disorder, unspecified: Secondary | ICD-10-CM | POA: Diagnosis not present

## 2023-05-13 ENCOUNTER — Other Ambulatory Visit (HOSPITAL_COMMUNITY): Payer: Self-pay

## 2023-05-18 DIAGNOSIS — G4733 Obstructive sleep apnea (adult) (pediatric): Secondary | ICD-10-CM | POA: Diagnosis not present

## 2023-05-29 DIAGNOSIS — G4733 Obstructive sleep apnea (adult) (pediatric): Secondary | ICD-10-CM | POA: Diagnosis not present

## 2023-06-04 NOTE — Progress Notes (Unsigned)
 Office Visit Note  Patient: Mariah Palmer             Date of Birth: 1975-12-11           MRN: [id number]             PCP: Charlton Amor, DO Referring: Charlton Amor, DO Visit Date: 06/18/2023 Occupation: @GUAROCC @  Subjective:  Medication monitoring   History of Present Illness: Mariah Palmer is a 48 y.o. female with history of seropositive rheumatoid arthritis.  Patient remains on  Plaquenil 200 mg 1 tablet by mouth twice daily Monday through Friday.  She is tolerating Plaquenil without any side effects and has not had any recent gaps in therapy.  Patient is currently in the process of moving to [place].  According to the patient she will be remaining employed by [employer] and will be working fully remote.  Her insurance will remain the same throughout the entirety of the year and she will be coming back to [facility] for events if needed. Patient states that she has had some increased arthralgias and joint stiffness with changes in weather especially between [place] and [place].  Patient states that overall her symptoms have remained stable despite the stress of moving and weather changes.  At times she will wear a brace on her left ankle for support.  She has not needed to take any over-the-counter products for symptomatic relief.  She has not taken prednisone recently.    Activities of Daily Living:  Patient reports morning stiffness for 1-3 hours.   Patient Denies nocturnal pain.  Difficulty dressing/grooming: Denies Difficulty climbing stairs: Denies Difficulty getting out of chair: Denies Difficulty using hands for taps, buttons, cutlery, and/or writing: Denies  Review of Systems  Constitutional:  Negative for fatigue.  HENT:  Negative for mouth sores, mouth dryness and nose dryness.   Eyes:  Negative for pain and dryness.  Respiratory:  Negative for shortness of breath and difficulty breathing.   Cardiovascular:  Negative for chest pain and palpitations.   Gastrointestinal:  Negative for blood in stool, constipation and diarrhea.  Endocrine: Negative for increased urination.  Genitourinary:  Negative for involuntary urination.  Musculoskeletal:  Positive for joint pain, joint pain, joint swelling and morning stiffness. Negative for gait problem, myalgias, muscle weakness, muscle tenderness and myalgias.  Skin:  Positive for rash. Negative for color change, hair loss and sensitivity to sunlight.  Allergic/Immunologic: Negative for susceptible to infections.  Neurological:  Negative for dizziness and headaches.  Hematological:  Negative for swollen glands.  Psychiatric/Behavioral:  Negative for depressed mood and sleep disturbance. The patient is not nervous/anxious.     PMFS History:  Patient Active Problem List   Diagnosis Date Noted  . Abnormal mammogram 10/05/2022  . Nasal crusting 09/22/2022  . Nasal turbinate hypertrophy 09/22/2022  . Sleep apnea 08/03/2022  . Irregular menses 05/03/2020  . Snoring 05/03/2020  . Rheumatoid arthritis involving multiple sites with positive rheumatoid factor (HCC) 09/13/2017  . High risk medication use 09/13/2017  . Primary osteoarthritis of both hands 09/13/2017  . Primary osteoarthritis of both feet 09/13/2017  . Rheumatoid factor positive 08/24/2017  . Psoriasis 10/24/2016  . Seasonal allergic rhinitis due to pollen 12/09/2015  . Acquired hypothyroidism 02/03/2012  . Alopecia areata 02/03/2012    Past Medical History:  Diagnosis Date  . Psoriasis   . Rheumatoid arthritis (HCC)   . Sleep apnea    mild, on CPAP  . Thyroid disease  Family History  Problem Relation Age of Onset  . Sleep apnea Mother        cpap started in her 33s  . Prostate cancer Father   . Heart disease Father   . Sleep apnea Father        on cpap since middle age  . Skin cancer Father   . Breast cancer Paternal Aunt    Past Surgical History:  Procedure Laterality Date  . DILATION AND CURETTAGE OF UTERUS    .  TONSILLECTOMY AND ADENOIDECTOMY     Social History   Social History Narrative   Caffeine: 2 cups coffee per day   Immunization History  Administered Date(s) Administered  . Influenza-Unspecified 02/03/2012, 01/12/2019, 01/12/2020, 01/09/2021, 12/30/2021  . PFIZER(Purple Top)SARS-COV-2 Vaccination 04/24/2019, 05/16/2019  . Tdap 02/03/2007, 05/03/2020     Objective: Vital Signs: BP 122/80 (BP Location: Left Arm, Patient Position: Sitting, Cuff Size: Normal)   Pulse 92   Resp 14   Ht 5\' 5"  (1.651 m)   Wt 158 lb 9.6 oz (71.9 kg)   BMI 26.39 kg/m    Physical Exam Vitals and nursing note reviewed.  Constitutional:      Appearance: She is well-developed.  HENT:     Head: Normocephalic and atraumatic.  Eyes:     Conjunctiva/sclera: Conjunctivae normal.  Cardiovascular:     Rate and Rhythm: Normal rate and regular rhythm.     Heart sounds: Normal heart sounds.  Pulmonary:     Effort: Pulmonary effort is normal.     Breath sounds: Normal breath sounds.  Abdominal:     General: Bowel sounds are normal.     Palpations: Abdomen is soft.  Musculoskeletal:     Cervical back: Normal range of motion.  Lymphadenopathy:     Cervical: No cervical adenopathy.  Skin:    General: Skin is warm and dry.     Capillary Refill: Capillary refill takes less than 2 seconds.  Neurological:     Mental Status: She is alert and oriented to person, place, and time.  Psychiatric:        Behavior: Behavior normal.     Musculoskeletal Exam: Cervical spine, thoracic spine, lumbar spine have good range of motion.  Shoulder joints and elbow joints have good range of motion.  Tenderness and thickening of the right wrist with limited extension.  Thickening of bilateral third MCP joints.  Tenderness and synovitis of the first MCP joint bilaterally.  Hip joints have good range of motion with no groin pain.  Knee joints have good range of motion with no warmth or effusion.  Tenderness and swelling of the left  ankle.  CDAI Exam: CDAI Score: -- Patient Global: --; Provider Global: -- Swollen: 5 ; Tender: 3  Joint Exam 06/18/2023      Right  Left  Wrist  Swollen      MCP 1  Swollen Tender  Swollen Tender  MCP 3     Swollen   Ankle     Swollen Tender     Investigation: No additional findings.  Imaging: No results found.  Recent Labs: Lab Results  Component Value Date   WBC 6.5 01/12/2023   HGB 13.0 01/12/2023   PLT 292 01/12/2023   NA 140 01/12/2023   K 4.2 01/12/2023   CL 104 01/12/2023   CO2 28 01/12/2023   GLUCOSE 93 01/12/2023   BUN 12 01/12/2023   CREATININE 0.72 01/12/2023   BILITOT 0.4 01/12/2023   ALKPHOS 61 02/26/2020  AST 16 01/12/2023   ALT 15 01/12/2023   PROT 7.4 01/12/2023   ALBUMIN 4.6 02/26/2020   CALCIUM 9.7 01/12/2023   GFRAA 109 05/16/2020   QFTBGOLDPLUS NEGATIVE 08/10/2019    Speciality Comments: PLQ eye exam 02/04/2023  WNL  [facility], f/u 1 year Plaquenil September 2021  Procedures:  No procedures performed Allergies: Patient has no known allergies.   Assessment / Plan:     Visit Diagnoses: Rheumatoid arthritis with rheumatoid factor of multiple sites without organ or systems involvement (HCC) - +RF, +CCP Ab, erosive disease: Patient remains on Plaquenil 200 mg 1 tablet by mouth twice daily Monday through Friday.  She is tolerating Plaquenil without any side effects.  X-rays were updated at her last office visit which revealed erosive changes but no radiographic progression was noted when compared to 2022.  Although there were new erosive changes when compared to x-rays from 2019.   Patient is currently in the process of moving to [place] and has noticed some increased arthralgias and joint stiffness with weather changes and increase stress.  She has not needed to take any over-the-counter products for symptomatic relief.  Patient feels that her symptoms have been manageable and does not want to make any medication changes.  A  refill of Plaquenil sent to the pharmacy today.  Patient will be looking into transferring care to a new rheumatologist in [place] as well as will be establishing care with a new ophthalmologist.  She is aware that she will require updated lab work in August 2025 and would like to follow back up with [place] in November 2025.   - Plan: hydroxychloroquine (PLAQUENIL) 200 MG tablet  High risk medication use - Plaquenil 200 mg 1 tablet by mouth twice daily Monday through Friday.  CBC and CMP updated on 01/12/23. Orders for CBC and CMP released today.  She will continue to require updated lab work every 5 months.  Patient plans on finding a lab closer to home in [place].  PLQ eye exam 02/04/2023 WNL [facility], f/u 1 year.  Patient was advised to establish care with ophthalmology once she moves to [place]. - Plan: CBC with Differential/Platelet, COMPLETE METABOLIC PANEL WITH GFR  Primary osteoarthritis of both hands: Clinical and radiographic findings are consistent with rheumatoid arthritis and osteoarthritis overlap.  Synovial thickening of the right wrist with some tenderness and inflammation on the ulnar aspect.  Tenderness and inflammation of bilateral first MCP joints noted.  Primary osteoarthritis of both feet: Patient has intermittent discomfort on the left ankle and wears a brace for support at times.  On examination she has tenderness and inflammation in the left ankle joint.  She declined a prednisone taper at this time.  She will notify us when and if she would like to return for an ultrasound-guided left ankle cortisone injection.  Psoriasis: Elbows and ears-She uses triamcinolone cream topically as needed.  Other medical conditions are listed as follows:   Acquired hypothyroidism  Hyperpigmentation  OSA (obstructive sleep apnea)  Orders: Orders Placed This Encounter  Procedures  . CBC with Differential/Platelet  . COMPLETE METABOLIC PANEL WITH GFR   Meds ordered this  encounter  Medications  . hydroxychloroquine (PLAQUENIL) 200 MG tablet    Sig: Take 1 tablet (200 mg total) by mouth 2 (two) times daily Monday through Friday.    Dispense:  120 tablet    Refill:  0    Follow-Up Instructions: Return in about 8 months (around 02/18/2024).  Gearldine Bienenstock, PA-C  Note - This record has been created using Dragon software.  Chart creation errors have been sought, but may not always  have been located. Such creation errors do not reflect on  the standard of medical care.

## 2023-06-15 ENCOUNTER — Ambulatory Visit: Payer: Commercial Managed Care - PPO | Admitting: Physician Assistant

## 2023-06-18 ENCOUNTER — Other Ambulatory Visit (HOSPITAL_COMMUNITY): Payer: Self-pay

## 2023-06-18 ENCOUNTER — Encounter: Payer: Self-pay | Admitting: Physician Assistant

## 2023-06-18 ENCOUNTER — Ambulatory Visit: Payer: Commercial Managed Care - PPO | Attending: Physician Assistant | Admitting: Physician Assistant

## 2023-06-18 VITALS — BP 122/80 | HR 92 | Resp 14 | Ht 65.0 in | Wt 158.6 lb

## 2023-06-18 DIAGNOSIS — M19072 Primary osteoarthritis, left ankle and foot: Secondary | ICD-10-CM

## 2023-06-18 DIAGNOSIS — Z79899 Other long term (current) drug therapy: Secondary | ICD-10-CM | POA: Diagnosis not present

## 2023-06-18 DIAGNOSIS — M19041 Primary osteoarthritis, right hand: Secondary | ICD-10-CM

## 2023-06-18 DIAGNOSIS — L409 Psoriasis, unspecified: Secondary | ICD-10-CM | POA: Diagnosis not present

## 2023-06-18 DIAGNOSIS — M19071 Primary osteoarthritis, right ankle and foot: Secondary | ICD-10-CM

## 2023-06-18 DIAGNOSIS — M0579 Rheumatoid arthritis with rheumatoid factor of multiple sites without organ or systems involvement: Secondary | ICD-10-CM | POA: Diagnosis not present

## 2023-06-18 DIAGNOSIS — E039 Hypothyroidism, unspecified: Secondary | ICD-10-CM

## 2023-06-18 DIAGNOSIS — G4733 Obstructive sleep apnea (adult) (pediatric): Secondary | ICD-10-CM | POA: Diagnosis not present

## 2023-06-18 DIAGNOSIS — L819 Disorder of pigmentation, unspecified: Secondary | ICD-10-CM

## 2023-06-18 DIAGNOSIS — M19042 Primary osteoarthritis, left hand: Secondary | ICD-10-CM | POA: Diagnosis not present

## 2023-06-18 MED ORDER — HYDROXYCHLOROQUINE SULFATE 200 MG PO TABS
200.0000 mg | ORAL_TABLET | Freq: Two times a day (BID) | ORAL | 0 refills | Status: DC
Start: 2023-06-18 — End: 2023-10-21
  Filled 2023-06-18: qty 120, 60d supply, fill #0
  Filled 2023-07-07: qty 120, 84d supply, fill #0

## 2023-06-19 LAB — CBC WITH DIFFERENTIAL/PLATELET
Absolute Lymphocytes: 1394 {cells}/uL (ref 850–3900)
Absolute Monocytes: 393 {cells}/uL (ref 200–950)
Basophils Absolute: 62 {cells}/uL (ref 0–200)
Basophils Relative: 0.8 %
Eosinophils Absolute: 39 {cells}/uL (ref 15–500)
Eosinophils Relative: 0.5 %
HCT: 38.3 % (ref 35.0–45.0)
Hemoglobin: 12.8 g/dL (ref 11.7–15.5)
MCH: 28.1 pg (ref 27.0–33.0)
MCHC: 33.4 g/dL (ref 32.0–36.0)
MCV: 84 fL (ref 80.0–100.0)
MPV: 9.4 fL (ref 7.5–12.5)
Monocytes Relative: 5.1 %
Neutro Abs: 5814 {cells}/uL (ref 1500–7800)
Neutrophils Relative %: 75.5 %
Platelets: 296 10*3/uL (ref 140–400)
RBC: 4.56 10*6/uL (ref 3.80–5.10)
RDW: 12.9 % (ref 11.0–15.0)
Total Lymphocyte: 18.1 %
WBC: 7.7 10*3/uL (ref 3.8–10.8)

## 2023-06-19 LAB — COMPREHENSIVE METABOLIC PANEL
AG Ratio: 1.3 (calc) (ref 1.0–2.5)
ALT: 10 U/L (ref 6–29)
AST: 14 U/L (ref 10–35)
Albumin: 4.1 g/dL (ref 3.6–5.1)
Alkaline phosphatase (APISO): 44 U/L (ref 31–125)
BUN: 11 mg/dL (ref 7–25)
CO2: 27 mmol/L (ref 20–32)
Calcium: 9.1 mg/dL (ref 8.6–10.2)
Chloride: 104 mmol/L (ref 98–110)
Creat: 0.81 mg/dL (ref 0.50–0.99)
Globulin: 3.1 g/dL (ref 1.9–3.7)
Glucose, Bld: 88 mg/dL (ref 65–99)
Potassium: 4.3 mmol/L (ref 3.5–5.3)
Sodium: 139 mmol/L (ref 135–146)
Total Bilirubin: 0.3 mg/dL (ref 0.2–1.2)
Total Protein: 7.2 g/dL (ref 6.1–8.1)
eGFR: 91 mL/min/{1.73_m2} (ref >=60)

## 2023-06-21 NOTE — Progress Notes (Signed)
 CBC and CMP WNL

## 2023-06-29 DIAGNOSIS — G4733 Obstructive sleep apnea (adult) (pediatric): Secondary | ICD-10-CM | POA: Diagnosis not present

## 2023-07-07 ENCOUNTER — Other Ambulatory Visit (HOSPITAL_COMMUNITY): Payer: Self-pay

## 2023-07-07 ENCOUNTER — Other Ambulatory Visit: Payer: Self-pay

## 2023-07-29 DIAGNOSIS — G4733 Obstructive sleep apnea (adult) (pediatric): Secondary | ICD-10-CM | POA: Diagnosis not present

## 2023-08-04 ENCOUNTER — Other Ambulatory Visit (HOSPITAL_COMMUNITY): Payer: Self-pay

## 2023-08-05 ENCOUNTER — Encounter: Payer: Self-pay | Admitting: Family Medicine

## 2023-08-09 ENCOUNTER — Other Ambulatory Visit (HOSPITAL_COMMUNITY): Payer: Self-pay

## 2023-08-09 DIAGNOSIS — M0579 Rheumatoid arthritis with rheumatoid factor of multiple sites without organ or systems involvement: Secondary | ICD-10-CM | POA: Diagnosis not present

## 2023-08-09 DIAGNOSIS — A493 Mycoplasma infection, unspecified site: Secondary | ICD-10-CM | POA: Diagnosis not present

## 2023-08-09 DIAGNOSIS — E063 Autoimmune thyroiditis: Secondary | ICD-10-CM | POA: Diagnosis not present

## 2023-08-09 DIAGNOSIS — Z77120 Contact with and (suspected) exposure to mold (toxic): Secondary | ICD-10-CM | POA: Diagnosis not present

## 2023-08-09 DIAGNOSIS — E559 Vitamin D deficiency, unspecified: Secondary | ICD-10-CM | POA: Diagnosis not present

## 2023-08-09 MED ORDER — THYROID 90 MG PO TABS
90.0000 mg | ORAL_TABLET | Freq: Every morning | ORAL | 3 refills | Status: AC
  Filled 2023-10-21: qty 90, 90d supply, fill #0

## 2023-08-16 DIAGNOSIS — G4733 Obstructive sleep apnea (adult) (pediatric): Secondary | ICD-10-CM | POA: Diagnosis not present

## 2023-08-29 DIAGNOSIS — G4733 Obstructive sleep apnea (adult) (pediatric): Secondary | ICD-10-CM | POA: Diagnosis not present

## 2023-09-01 ENCOUNTER — Telehealth: Admitting: Physician Assistant

## 2023-09-01 DIAGNOSIS — B9689 Other specified bacterial agents as the cause of diseases classified elsewhere: Secondary | ICD-10-CM

## 2023-09-01 DIAGNOSIS — J019 Acute sinusitis, unspecified: Secondary | ICD-10-CM

## 2023-09-01 MED ORDER — AMOXICILLIN-POT CLAVULANATE 875-125 MG PO TABS
1.0000 | ORAL_TABLET | Freq: Two times a day (BID) | ORAL | 0 refills | Status: AC
Start: 2023-09-01 — End: ?

## 2023-09-01 NOTE — Progress Notes (Signed)

## 2023-09-10 ENCOUNTER — Other Ambulatory Visit: Payer: Self-pay | Admitting: Medical-Surgical

## 2023-09-10 ENCOUNTER — Encounter: Payer: Self-pay | Admitting: Rheumatology

## 2023-09-10 DIAGNOSIS — M0579 Rheumatoid arthritis with rheumatoid factor of multiple sites without organ or systems involvement: Secondary | ICD-10-CM

## 2023-09-10 DIAGNOSIS — Z1231 Encounter for screening mammogram for malignant neoplasm of breast: Secondary | ICD-10-CM

## 2023-09-10 NOTE — Telephone Encounter (Signed)
 Ok to place referral.

## 2023-09-13 ENCOUNTER — Encounter: Payer: Self-pay | Admitting: *Deleted

## 2023-09-14 ENCOUNTER — Telehealth: Payer: Self-pay

## 2023-09-14 ENCOUNTER — Encounter: Payer: Self-pay | Admitting: Rheumatology

## 2023-09-14 ENCOUNTER — Other Ambulatory Visit (HOSPITAL_COMMUNITY): Payer: Self-pay

## 2023-09-14 ENCOUNTER — Other Ambulatory Visit: Payer: Self-pay

## 2023-09-14 MED ORDER — CLOBETASOL PROPIONATE 0.05 % EX SOLN
1.0000 | Freq: Two times a day (BID) | CUTANEOUS | 0 refills | Status: AC
  Filled 2023-09-14: qty 50, 25d supply, fill #0

## 2023-09-14 NOTE — Telephone Encounter (Signed)
 error

## 2023-09-14 NOTE — Telephone Encounter (Signed)
 Last Fill: 05/16/2020  Next Visit: 02/18/2024  Last Visit: 06/18/2023  Dx: Psoriasis   Current Dose per office note on 05/16/2020: Not discussed  Okay to refill Clobetasol  Cream?

## 2023-09-15 ENCOUNTER — Other Ambulatory Visit: Payer: Self-pay

## 2023-09-22 ENCOUNTER — Telehealth: Payer: Self-pay | Admitting: Adult Health

## 2023-09-22 ENCOUNTER — Encounter: Payer: Self-pay | Admitting: Neurology

## 2023-09-22 DIAGNOSIS — G4733 Obstructive sleep apnea (adult) (pediatric): Secondary | ICD-10-CM

## 2023-09-22 NOTE — Telephone Encounter (Signed)
 Referral placed.

## 2023-09-22 NOTE — Telephone Encounter (Signed)
 Referral for sleep studies fax to [facility]. Phone: [phone], Fax: [phone]

## 2023-09-28 DIAGNOSIS — G4733 Obstructive sleep apnea (adult) (pediatric): Secondary | ICD-10-CM | POA: Diagnosis not present

## 2023-10-11 ENCOUNTER — Ambulatory Visit

## 2023-10-21 ENCOUNTER — Other Ambulatory Visit: Payer: Self-pay | Admitting: Physician Assistant

## 2023-10-21 DIAGNOSIS — M0579 Rheumatoid arthritis with rheumatoid factor of multiple sites without organ or systems involvement: Secondary | ICD-10-CM

## 2023-10-22 ENCOUNTER — Other Ambulatory Visit: Payer: Self-pay

## 2023-10-22 ENCOUNTER — Other Ambulatory Visit (HOSPITAL_COMMUNITY): Payer: Self-pay

## 2023-10-22 MED ORDER — HYDROXYCHLOROQUINE SULFATE 200 MG PO TABS
200.0000 mg | ORAL_TABLET | Freq: Two times a day (BID) | ORAL | 0 refills | Status: DC
Start: 2023-10-22 — End: 2024-01-18
  Filled 2023-10-22: qty 120, 60d supply, fill #0

## 2023-10-22 NOTE — Telephone Encounter (Signed)
 Last Fill: 06/18/2023  Eye exam: 11//09/2023 WNL   Labs: 06/18/2023 CBC and CMP WNL  Next Visit: 02/18/2024  Last Visit: 06/18/2023  IK:Myzlfjunpi arthritis with rheumatoid factor of multiple sites without organ or systems involvement   Current Dose per office note 06/18/2023: 200 mg 1 tablet by mouth twice daily Monday through Friday.  Okay to refill Plaquenil ?

## 2023-10-28 DIAGNOSIS — Z1331 Encounter for screening for depression: Secondary | ICD-10-CM | POA: Diagnosis not present

## 2023-10-28 DIAGNOSIS — E039 Hypothyroidism, unspecified: Secondary | ICD-10-CM | POA: Diagnosis not present

## 2023-10-28 DIAGNOSIS — Z808 Family history of malignant neoplasm of other organs or systems: Secondary | ICD-10-CM | POA: Diagnosis not present

## 2023-10-28 DIAGNOSIS — M05742 Rheumatoid arthritis with rheumatoid factor of left hand without organ or systems involvement: Secondary | ICD-10-CM | POA: Diagnosis not present

## 2023-10-28 DIAGNOSIS — G4733 Obstructive sleep apnea (adult) (pediatric): Secondary | ICD-10-CM | POA: Diagnosis not present

## 2023-10-28 DIAGNOSIS — G4720 Circadian rhythm sleep disorder, unspecified type: Secondary | ICD-10-CM | POA: Diagnosis not present

## 2023-10-28 DIAGNOSIS — L400 Psoriasis vulgaris: Secondary | ICD-10-CM | POA: Diagnosis not present

## 2023-10-28 DIAGNOSIS — M05741 Rheumatoid arthritis with rheumatoid factor of right hand without organ or systems involvement: Secondary | ICD-10-CM | POA: Diagnosis not present

## 2023-10-28 DIAGNOSIS — Z0000 Encounter for general adult medical examination without abnormal findings: Secondary | ICD-10-CM | POA: Diagnosis not present

## 2023-11-18 DIAGNOSIS — G4733 Obstructive sleep apnea (adult) (pediatric): Secondary | ICD-10-CM | POA: Diagnosis not present

## 2024-01-04 DIAGNOSIS — D2272 Melanocytic nevi of left lower limb, including hip: Secondary | ICD-10-CM | POA: Diagnosis not present

## 2024-01-04 DIAGNOSIS — D225 Melanocytic nevi of trunk: Secondary | ICD-10-CM | POA: Diagnosis not present

## 2024-01-04 DIAGNOSIS — L409 Psoriasis, unspecified: Secondary | ICD-10-CM | POA: Diagnosis not present

## 2024-01-04 DIAGNOSIS — D2271 Melanocytic nevi of right lower limb, including hip: Secondary | ICD-10-CM | POA: Diagnosis not present

## 2024-01-04 DIAGNOSIS — D2261 Melanocytic nevi of right upper limb, including shoulder: Secondary | ICD-10-CM | POA: Diagnosis not present

## 2024-01-04 DIAGNOSIS — L814 Other melanin hyperpigmentation: Secondary | ICD-10-CM | POA: Diagnosis not present

## 2024-01-04 DIAGNOSIS — C44319 Basal cell carcinoma of skin of other parts of face: Secondary | ICD-10-CM | POA: Diagnosis not present

## 2024-01-04 DIAGNOSIS — D2239 Melanocytic nevi of other parts of face: Secondary | ICD-10-CM | POA: Diagnosis not present

## 2024-01-04 DIAGNOSIS — D2262 Melanocytic nevi of left upper limb, including shoulder: Secondary | ICD-10-CM | POA: Diagnosis not present

## 2024-01-18 ENCOUNTER — Other Ambulatory Visit: Payer: Self-pay | Admitting: Physician Assistant

## 2024-01-18 ENCOUNTER — Other Ambulatory Visit: Payer: Self-pay

## 2024-01-18 ENCOUNTER — Other Ambulatory Visit: Payer: Self-pay | Admitting: *Deleted

## 2024-01-18 DIAGNOSIS — Z79899 Other long term (current) drug therapy: Secondary | ICD-10-CM

## 2024-01-18 DIAGNOSIS — M0579 Rheumatoid arthritis with rheumatoid factor of multiple sites without organ or systems involvement: Secondary | ICD-10-CM

## 2024-01-18 MED ORDER — HYDROXYCHLOROQUINE SULFATE 200 MG PO TABS
200.0000 mg | ORAL_TABLET | Freq: Two times a day (BID) | ORAL | 1 refills | Status: AC
Start: 2024-01-18 — End: ?
  Filled 2024-01-18: qty 40, 28d supply, fill #0

## 2024-01-18 NOTE — Telephone Encounter (Signed)
 Last Fill: 10/22/2023  Eye exam: 02/04/2023  WNL    Labs: 06/18/2023 CBC and CMP WNL   Next Visit: Patient moved. Patient is scheduled with new rheum on 02/28/2024  Last Visit: 06/18/2023  IK:Myzlfjunpi arthritis with rheumatoid factor of multiple sites without organ or systems involvement   Current Dose per office note 06/18/2023:  Plaquenil  200 mg 1 tablet by mouth twice daily Monday through Friday.   Patient to update labs this week. Orders released.   Okay to refill Plaquenil ?

## 2024-01-19 ENCOUNTER — Encounter: Payer: Self-pay | Admitting: Adult Health

## 2024-01-20 DIAGNOSIS — Z79899 Other long term (current) drug therapy: Secondary | ICD-10-CM | POA: Diagnosis not present

## 2024-01-20 DIAGNOSIS — G4733 Obstructive sleep apnea (adult) (pediatric): Secondary | ICD-10-CM | POA: Diagnosis not present

## 2024-01-21 ENCOUNTER — Ambulatory Visit: Payer: Self-pay | Admitting: Rheumatology

## 2024-01-21 LAB — CBC WITH DIFFERENTIAL/PLATELET
Absolute Lymphocytes: 1931 {cells}/uL (ref 850–3900)
Absolute Monocytes: 618 {cells}/uL (ref 200–950)
Basophils Absolute: 78 {cells}/uL (ref 0–200)
Basophils Relative: 0.9 %
Eosinophils Absolute: 191 {cells}/uL (ref 15–500)
Eosinophils Relative: 2.2 %
HCT: 39.1 % (ref 35.0–45.0)
Hemoglobin: 13 g/dL (ref 11.7–15.5)
MCH: 27.7 pg (ref 27.0–33.0)
MCHC: 33.2 g/dL (ref 32.0–36.0)
MCV: 83.4 fL (ref 80.0–100.0)
MPV: 9.7 fL (ref 7.5–12.5)
Monocytes Relative: 7.1 %
Neutro Abs: 5881 {cells}/uL (ref 1500–7800)
Neutrophils Relative %: 67.6 %
Platelets: 322 Thousand/uL (ref 140–400)
RBC: 4.69 Million/uL (ref 3.80–5.10)
RDW: 13.2 % (ref 11.0–15.0)
Total Lymphocyte: 22.2 %
WBC: 8.7 Thousand/uL (ref 3.8–10.8)

## 2024-01-21 LAB — COMPREHENSIVE METABOLIC PANEL WITH GFR
AG Ratio: 1.6 (calc) (ref 1.0–2.5)
ALT: 13 U/L (ref 6–29)
AST: 14 U/L (ref 10–35)
Albumin: 4.7 g/dL (ref 3.6–5.1)
Alkaline phosphatase (APISO): 49 U/L (ref 31–125)
BUN: 16 mg/dL (ref 7–25)
CO2: 28 mmol/L (ref 20–32)
Calcium: 9.6 mg/dL (ref 8.6–10.2)
Chloride: 103 mmol/L (ref 98–110)
Creat: 0.74 mg/dL (ref 0.50–0.99)
Globulin: 3 g/dL (ref 1.9–3.7)
Glucose, Bld: 95 mg/dL (ref 65–99)
Potassium: 3.8 mmol/L (ref 3.5–5.3)
Sodium: 139 mmol/L (ref 135–146)
Total Bilirubin: 0.4 mg/dL (ref 0.2–1.2)
Total Protein: 7.7 g/dL (ref 6.1–8.1)
eGFR: 101 mL/min/1.73m2 (ref >=60)

## 2024-01-25 DIAGNOSIS — Z23 Encounter for immunization: Secondary | ICD-10-CM | POA: Diagnosis not present

## 2024-01-26 DIAGNOSIS — C44319 Basal cell carcinoma of skin of other parts of face: Secondary | ICD-10-CM | POA: Diagnosis not present

## 2024-02-07 ENCOUNTER — Other Ambulatory Visit (HOSPITAL_COMMUNITY): Payer: Self-pay

## 2024-02-07 ENCOUNTER — Other Ambulatory Visit: Payer: Self-pay

## 2024-02-07 ENCOUNTER — Telehealth: Payer: Commercial Managed Care - PPO | Admitting: Adult Health

## 2024-02-07 DIAGNOSIS — E559 Vitamin D deficiency, unspecified: Secondary | ICD-10-CM | POA: Diagnosis not present

## 2024-02-07 DIAGNOSIS — C4491 Basal cell carcinoma of skin, unspecified: Secondary | ICD-10-CM | POA: Diagnosis not present

## 2024-02-07 DIAGNOSIS — A493 Mycoplasma infection, unspecified site: Secondary | ICD-10-CM | POA: Diagnosis not present

## 2024-02-07 DIAGNOSIS — E063 Autoimmune thyroiditis: Secondary | ICD-10-CM | POA: Diagnosis not present

## 2024-02-07 DIAGNOSIS — M0579 Rheumatoid arthritis with rheumatoid factor of multiple sites without organ or systems involvement: Secondary | ICD-10-CM | POA: Diagnosis not present

## 2024-02-07 DIAGNOSIS — Z77120 Contact with and (suspected) exposure to mold (toxic): Secondary | ICD-10-CM | POA: Diagnosis not present

## 2024-02-07 DIAGNOSIS — E039 Hypothyroidism, unspecified: Secondary | ICD-10-CM | POA: Diagnosis not present

## 2024-02-07 MED ORDER — LEVOTHYROXINE SODIUM 25 MCG PO TABS
25.0000 ug | ORAL_TABLET | Freq: Every morning | ORAL | 4 refills | Status: AC
  Filled 2024-02-07: qty 90, 90d supply, fill #0

## 2024-02-07 MED ORDER — THYROID 90 MG PO TABS
90.0000 mg | ORAL_TABLET | Freq: Every morning | ORAL | 3 refills | Status: AC
  Filled 2024-02-07: qty 90, 90d supply, fill #0

## 2024-02-07 MED ORDER — PROGESTERONE MICRONIZED 100 MG PO CAPS
100.0000 mg | ORAL_CAPSULE | Freq: Every evening | ORAL | 4 refills | Status: AC
  Filled 2024-02-07: qty 90, 90d supply, fill #0

## 2024-02-08 DIAGNOSIS — H5213 Myopia, bilateral: Secondary | ICD-10-CM | POA: Diagnosis not present

## 2024-02-18 ENCOUNTER — Ambulatory Visit: Admitting: Rheumatology

## 2024-02-28 ENCOUNTER — Other Ambulatory Visit (HOSPITAL_COMMUNITY): Payer: Self-pay

## 2024-02-28 DIAGNOSIS — M05741 Rheumatoid arthritis with rheumatoid factor of right hand without organ or systems involvement: Secondary | ICD-10-CM | POA: Diagnosis not present

## 2024-02-28 DIAGNOSIS — M05742 Rheumatoid arthritis with rheumatoid factor of left hand without organ or systems involvement: Secondary | ICD-10-CM | POA: Diagnosis not present

## 2024-02-28 MED ORDER — HYDROXYCHLOROQUINE SULFATE 200 MG PO TABS
400.0000 mg | ORAL_TABLET | Freq: Every day | ORAL | 3 refills | Status: AC
  Filled 2024-02-28: qty 180, 90d supply, fill #0

## 2024-03-01 DIAGNOSIS — M05741 Rheumatoid arthritis with rheumatoid factor of right hand without organ or systems involvement: Secondary | ICD-10-CM | POA: Diagnosis not present

## 2024-03-01 DIAGNOSIS — M19071 Primary osteoarthritis, right ankle and foot: Secondary | ICD-10-CM | POA: Diagnosis not present

## 2024-03-01 DIAGNOSIS — M19072 Primary osteoarthritis, left ankle and foot: Secondary | ICD-10-CM | POA: Diagnosis not present

## 2024-03-01 DIAGNOSIS — Z0389 Encounter for observation for other suspected diseases and conditions ruled out: Secondary | ICD-10-CM | POA: Diagnosis not present

## 2024-03-14 DIAGNOSIS — Z1231 Encounter for screening mammogram for malignant neoplasm of breast: Secondary | ICD-10-CM | POA: Diagnosis not present

## 2024-03-20 DIAGNOSIS — G4733 Obstructive sleep apnea (adult) (pediatric): Secondary | ICD-10-CM | POA: Diagnosis not present
